# Patient Record
Sex: Female | Born: 1995 | Race: White | Hispanic: No | Marital: Married | Smoking: Never smoker
Health system: Southern US, Community
[De-identification: ages and names within clinical notes are randomized; demographics above are authoritative.]

## PROBLEM LIST (undated history)

## (undated) DIAGNOSIS — Z87898 Personal history of other specified conditions: Secondary | ICD-10-CM

## (undated) DIAGNOSIS — R569 Unspecified convulsions: Secondary | ICD-10-CM

## (undated) HISTORY — PX: WISDOM TOOTH EXTRACTION: SHX21

## (undated) HISTORY — DX: Personal history of other specified conditions: Z87.898

## (undated) HISTORY — DX: Unspecified convulsions: R56.9

---

## 2018-09-21 DIAGNOSIS — I951 Orthostatic hypotension: Secondary | ICD-10-CM

## 2018-09-21 HISTORY — DX: Orthostatic hypotension: I95.1

## 2019-09-22 NOTE — L&D Delivery Note (Addendum)
OB/GYN Faculty Practice Delivery Note  Rachel Marshall is a 24 y.o. G1P1001 s/p SVD at [redacted]w[redacted]d. She was admitted for IOL for gHTN.   ROM: 5h 105m with clear fluid GBS Status: Negative Maximum Maternal Temperature: 99.5 F  Labor Progress:  Received Cytotec and Foley bulb was placed for cervical ripening. SROM occurred. Started on pitocin. Progressed to complete without complication.  Delivery Date/Time: 03/09/2020 at 1010 Delivery: Called to room and patient was complete and pushing. Head delivered LOA. Loose nuchal cord present, which was reduced at the perineum. Shoulder and body delivered in usual fashion. Infant with spontaneous cry, placed on mother's abdomen, dried and stimulated. Cord clamped x 2 after 1-minute delay, and cut by FOB under my direct supervision. Cord blood drawn. Placenta delivered spontaneously with gentle cord traction. Fundus firm with massage and Pitocin. Labia, perineum, vagina, and cervix were inspected, found to have a left labial laceration, which was repaired with 3-0 Monocryl in the usual fashion with hemostasis.   Placenta: Intact, 3 vessel cord Complications: None Lacerations: Left labial EBL: 250 cc Analgesia: Epidural  Infant: Viable female   APGARs 8/8   per medical record   EMILY Genene Churn, MD PGY-2 Resident, Family Medicine 03/09/2020, 11:08 AM  OB FELLOW DELIVERY ATTESTATION  I was gloved and present for the delivery in its entirety, and I agree with the above resident's note.    Jerilynn Birkenhead, MD [facility] Family Medicine Fellow, [facility] for [employer], [facility]

## 2019-11-21 ENCOUNTER — Encounter: Payer: Self-pay | Admitting: Certified Nurse Midwife

## 2019-11-21 ENCOUNTER — Encounter: Payer: Self-pay | Admitting: *Deleted

## 2019-11-21 ENCOUNTER — Other Ambulatory Visit: Payer: Self-pay

## 2019-11-21 ENCOUNTER — Ambulatory Visit (INDEPENDENT_AMBULATORY_CARE_PROVIDER_SITE_OTHER): Payer: BC Managed Care – PPO | Admitting: Certified Nurse Midwife

## 2019-11-21 VITALS — BP 122/64 | HR 93 | Temp 98.6°F | Ht 69.0 in | Wt 205.0 lb

## 2019-11-21 DIAGNOSIS — Z3402 Encounter for supervision of normal first pregnancy, second trimester: Secondary | ICD-10-CM

## 2019-11-21 DIAGNOSIS — Z3490 Encounter for supervision of normal pregnancy, unspecified, unspecified trimester: Secondary | ICD-10-CM | POA: Insufficient documentation

## 2019-11-21 DIAGNOSIS — O26842 Uterine size-date discrepancy, second trimester: Secondary | ICD-10-CM

## 2019-11-21 DIAGNOSIS — Z3A23 23 weeks gestation of pregnancy: Secondary | ICD-10-CM

## 2019-11-21 DIAGNOSIS — O0932 Supervision of pregnancy with insufficient antenatal care, second trimester: Secondary | ICD-10-CM

## 2019-11-21 DIAGNOSIS — O0930 Supervision of pregnancy with insufficient antenatal care, unspecified trimester: Secondary | ICD-10-CM

## 2019-11-21 NOTE — Progress Notes (Signed)
History:   Rachel Marshall is a 24 y.o. G1P0 at [redacted]w[redacted]d by LMP being seen today for her first obstetrical visit. She is a transfer from [place]. She started prenatal care there at 17 weeks. Patient does intend to breast feed. Pregnancy history fully reviewed.  Patient reports no complaints.     HISTORY: OB History  Gravida Para Term Preterm AB Living  1 0 0 0 0 0  SAB TAB Ectopic Multiple Live Births  0 0 0 0 0    # Outcome Date GA Lbr Len/2nd Weight Sex Delivery Anes PTL Lv  1 Current             Past Medical History:  Diagnosis Date  . History of syncope    History reviewed. No pertinent surgical history. Family History  Problem Relation Age of Onset  . Breast cancer Maternal Grandmother    Social History   Tobacco Use  . Smoking status: Never Smoker  . Smokeless tobacco: Never Used  Substance Use Topics  . Alcohol use: Not Currently  . Drug use: Never   No Known Allergies Current Outpatient Medications on File Prior to Visit  Medication Sig Dispense Refill  . Prenatal Vit-Fe Fumarate-FA (PRENATAL VITAMINS PO) Take by mouth.     No current facility-administered medications on file prior to visit.    Review of Systems Pertinent items noted in HPI and remainder of comprehensive ROS otherwise negative. Physical Exam:   Vitals:   11/21/19 0957 11/21/19 0959  BP: 122/64   Pulse: 93   Temp: 98.6 F (37 C)   Weight: 205 lb (93 kg)   Height:  5\' 9"  (1.753 m)   Fetal Heart Rate (bpm): 147 Uterus:  Fundal Height: 28 cm  System: General: well-developed, well-nourished female in no acute distress   Breasts:  normal appearance, no masses or tenderness bilaterally   Skin: normal coloration and turgor, no rashes   Neurologic: oriented, normal, negative, normal mood   Extremities: normal strength, tone, and muscle mass, ROM of all joints is normal   HEENT PERRLA, extraocular movement intact and sclera clear, anicteric   Mouth/Teeth mucous membranes moist, pharynx  normal without lesions and dental hygiene good   Neck supple and no masses   Cardiovascular: regular rate and rhythm   Respiratory:  no respiratory distress, normal breath sounds   Abdomen: S>D, soft, non-tender; bowel sounds normal; no masses,  no organomegaly       Assessment:    Pregnancy: G1P0 Patient Active Problem List   Diagnosis Date Noted  . Supervision of normal pregnancy 11/21/2019  . Late prenatal care 11/21/2019     Plan:    1. Encounter for supervision of normal first pregnancy in second trimester - Welcomed to practice and introduced self to patient  - Prenatal records and anatomy 01/21/2020 reviewed - patient needs follow up US for completion based on records - Routine prenatal care - Anticipatory guidance on upcoming appointments - Reviewed safety, visitor policy, reassurance about COVID-19 for pregnancy at this time. Discussed possible changes to visits, including televisits, that may occur due to COVID-19.  The office remains open if pt needs to be seen and MAU is open 24 hours/day for OB emergencies. - Educated and discussed RLP during pregnancy and what to expect in the second trimester  - Educated and discussed GTT at next prenatal appointment, encouraged to fast after MN prior to appointment, patient verbalizes understanding.  - Babyscripts Schedule Optimization - [place] MFM OB COMP +  [place]; Future  2. Late prenatal care - Care initiated around 17 weeks in [place], had one prenatal visit (initial) prior to transfer and move to [place].   3. Uterine size-date discrepancy in second trimester - S>D, follow up US ordered to assess fetal growth - Watch FH closely    Continue prenatal vitamins. Genetic Screening discussed, AFP and NIPS: declined. Ultrasound discussed; fetal anatomic survey: ordered. Problem list reviewed and updated. The nature of [place] with multiple MDs and other Advanced Practice Providers was explained to patient;  also emphasized that residents, students are part of our team. Routine obstetric precautions reviewed. Return in about 4 weeks (around 12/19/2019) for ROB/GTT.     Lajean Manes, [place] for [employer], [place]

## 2019-11-21 NOTE — Patient Instructions (Signed)
Glucose Tolerance Test During Pregnancy Why am I having this test? The glucose tolerance test (GTT) is done to check how your body processes sugar (glucose). This is one of several tests used to diagnose diabetes that develops during pregnancy (gestational diabetes mellitus). Gestational diabetes is a temporary form of diabetes that some women develop during pregnancy. It usually occurs during the second trimester of pregnancy and goes away after delivery. Testing (screening) for gestational diabetes usually occurs between 24 and 28 weeks of pregnancy. You may have the GTT test after having a 1-hour glucose screening test if the results from that test indicate that you may have gestational diabetes. You may also have this test if:  You have a history of gestational diabetes.  You have a history of giving birth to very large babies or have experienced repeated fetal loss (stillbirth).  You have signs and symptoms of diabetes, such as: ? Changes in your vision. ? Tingling or numbness in your hands or feet. ? Changes in hunger, thirst, and urination that are not otherwise explained by your pregnancy. What is being tested? This test measures the amount of glucose in your blood at different times during a period of 3 hours. This indicates how well your body is able to process glucose. What kind of sample is taken?  Blood samples are required for this test. They are usually collected by inserting a needle into a blood vessel. How do I prepare for this test?  For 3 days before your test, eat normally. Have plenty of carbohydrate-rich foods.  Follow instructions from your health care provider about: ? Eating or drinking restrictions on the day of the test. You may be asked to not eat or drink anything other than water (fast) starting 8-10 hours before the test. ? Changing or stopping your regular medicines. Some medicines may interfere with this test. Tell a health care provider about:  All  medicines you are taking, including vitamins, herbs, eye drops, creams, and over-the-counter medicines.  Any blood disorders you have.  Any surgeries you have had.  Any medical conditions you have. What happens during the test? First, your blood glucose will be measured. This is referred to as your fasting blood glucose, since you fasted before the test. Then, you will drink a glucose solution that contains a certain amount of glucose. Your blood glucose will be measured again 1, 2, and 3 hours after drinking the solution. This test takes about 3 hours to complete. You will need to stay at the testing location during this time. During the testing period:  Do not eat or drink anything other than the glucose solution.  Do not exercise.  Do not use any products that contain nicotine or tobacco, such as cigarettes and e-cigarettes. If you need help stopping, ask your health care provider. The testing procedure may vary among health care providers and hospitals. How are the results reported? Your results will be reported as milligrams of glucose per deciliter of blood (mg/dL) or millimoles per liter (mmol/L). Your health care provider will compare your results to normal ranges that were established after testing a large group of people (reference ranges). Reference ranges may vary among labs and hospitals. For this test, common reference ranges are:  Fasting: less than 95-105 mg/dL (5.3-5.8 mmol/L).  1 hour after drinking glucose: less than 180-190 mg/dL (10.0-10.5 mmol/L).  2 hours after drinking glucose: less than 155-165 mg/dL (8.6-9.2 mmol/L).  3 hours after drinking glucose: 140-145 mg/dL (7.8-8.1 mmol/L). What do the   results mean? Results within reference ranges are considered normal, meaning that your glucose levels are well-controlled. If two or more of your blood glucose levels are high, you may be diagnosed with gestational diabetes. If only one level is high, your health care  provider may suggest repeat testing or other tests to confirm a diagnosis. Talk with your health care provider about what your results mean. Questions to ask your health care provider Ask your health care provider, or the department that is doing the test:  When will my results be ready?  How will I get my results?  What are my treatment options?  What other tests do I need?  What are my next steps? Summary  The glucose tolerance test (GTT) is one of several tests used to diagnose diabetes that develops during pregnancy (gestational diabetes mellitus). Gestational diabetes is a temporary form of diabetes that some women develop during pregnancy.  You may have the GTT test after having a 1-hour glucose screening test if the results from that test indicate that you may have gestational diabetes. You may also have this test if you have any symptoms or risk factors for gestational diabetes.  Talk with your health care provider about what your results mean. This information is not intended to replace advice given to you by your health care provider. Make sure you discuss any questions you have with your health care provider. Document Revised: 12/29/2018 Document Reviewed: 04/19/2017 Elsevier Patient Education  2020 [employer]  

## 2019-12-05 ENCOUNTER — Encounter: Payer: Self-pay | Admitting: Certified Nurse Midwife

## 2019-12-07 ENCOUNTER — Other Ambulatory Visit (HOSPITAL_COMMUNITY): Payer: Self-pay | Admitting: *Deleted

## 2019-12-07 ENCOUNTER — Ambulatory Visit (HOSPITAL_COMMUNITY)
Admission: RE | Admit: 2019-12-07 | Discharge: 2019-12-07 | Disposition: A | Discharge status: Home / Self Care | Payer: BC Managed Care – PPO | Source: Ambulatory Visit | Attending: Obstetrics and Gynecology | Admitting: Obstetrics and Gynecology

## 2019-12-07 ENCOUNTER — Other Ambulatory Visit: Payer: Self-pay

## 2019-12-07 DIAGNOSIS — Z363 Encounter for antenatal screening for malformations: Secondary | ICD-10-CM

## 2019-12-07 DIAGNOSIS — O26842 Uterine size-date discrepancy, second trimester: Secondary | ICD-10-CM

## 2019-12-07 DIAGNOSIS — Z3A24 24 weeks gestation of pregnancy: Secondary | ICD-10-CM

## 2019-12-07 DIAGNOSIS — Z3402 Encounter for supervision of normal first pregnancy, second trimester: Secondary | ICD-10-CM

## 2019-12-07 DIAGNOSIS — O321XX0 Maternal care for breech presentation, not applicable or unspecified: Secondary | ICD-10-CM | POA: Diagnosis not present

## 2019-12-07 DIAGNOSIS — Z362 Encounter for other antenatal screening follow-up: Secondary | ICD-10-CM

## 2019-12-15 ENCOUNTER — Ambulatory Visit (INDEPENDENT_AMBULATORY_CARE_PROVIDER_SITE_OTHER): Payer: BC Managed Care – PPO | Admitting: Certified Nurse Midwife

## 2019-12-15 ENCOUNTER — Other Ambulatory Visit: Payer: Self-pay

## 2019-12-15 VITALS — BP 123/66 | HR 83 | Wt 213.0 lb

## 2019-12-15 DIAGNOSIS — Z3480 Encounter for supervision of other normal pregnancy, unspecified trimester: Secondary | ICD-10-CM

## 2019-12-15 NOTE — Patient Instructions (Signed)
Pediatrician List [place]  [facility] @ [place] [address] [place], [place] [phone]  [facility] @ [facility] [address] [place], [place] [phone]  [facility] @ [place] [address] [place], [place] [phone]  [facility] [address] [place], [place] [phone]  [facility] @ [facility] [address] [place], [place] [phone]  [facility] @ [place] [address] [place], [place] [phone]  [facility] (Pediatrics) [address] [place], [place] [phone]  [facility] @ [address] [phone]  [facility] [address] [place], [place] [phone]  [facility] [address] [place], [place] [phone]  [facility] [address] [phone]  [address] [phone]   AREA PEDIATRIC/FAMILY PRACTICE PHYSICIANS  [facility] [place] [address] [place], [place] [address] Phone - [phone]   Fax - [phone]  JACK AMOS [address] [place], [place]  [address] Phone - [phone]   Fax - [phone]  [facility] [address] [place], [place]  [address] Phone - [phone]   Fax - [phone]  [facility] [address] [place], [place]  [address] Phone - [phone]   Fax - [phone]  [facility] [address] [place], [place]  [address] Phone - [phone]   Fax - [phone]  [facility] [address] [place], [place]  [address] Phone - [phone]   Fax - [phone]  [facility] [place] [address] [place], [place]  [address] Phone -  [phone]   Fax - [phone]  [facility] AT [facility] [address] [place], [address] [place], [place]  [address] Phone - [phone]   Fax - [phone]  [facility] AT [facility] [address] [place], [place]  [address] Phone - [phone]   Fax - [phone] [facility] AT [place] [address] [place], [place]  [address] Phone - [phone]   Fax - [phone]  [facility] [address] [place], [place]  [address] Phone - [phone]   Fax - [phone]  [facility] [address] [place], [place]  [address] Phone - [phone]   Fax - [phone]  [facility] [place] [address] [place], [place]  [address] Phone - [phone]   Fax - [phone]  [facility] [address] [place], [place]  [address] Phone - [phone]  [facility] [address] [place], [place]  [address] Phone - [phone]   Fax - [phone]  [facility] [address] [place], [place]  [address] Phone - [phone]   Fax - [phone]  [facility] [address] [place], [place]  [address] Phone - [phone]   Fax - [phone]  [facility] [address] [place], [place]  [address] Phone - [phone]   Fax - [phone]   [facility] [address] [place] [address] [place], [place]  [address] Phone - [phone]   Fax - [phone]  [facility] [address] [place], [place]  [address] Phone - [phone]   Fax - [phone]  DAVID RUBIN [address] [place], [place]  [address] Phone - [phone]   Fax - [phone]  [facility] [address] [place], [place]  [address] Phone - [phone]   Fax - [phone]  [facility] [place] [address]  [place], [place]  [address] Phone - [phone]   Fax - [phone] Gerarda Fraction [address] [place], [place]  [address] Phone - [phone]   Fax - [phone]  [facility] [address] [place], [place]  [address] Phone - [phone]   Fax - [phone]  [facility] - [place] [address] [place], [place]  [address] Phone - [phone]   Fax - [phone]    Third Trimester of Pregnancy The third trimester is from week 28 through week 40 (months 7 through 9). The third trimester is a time when the unborn baby (fetus) is growing rapidly. At the end of the ninth month, the fetus is about 20 inches in length and weighs 6-10 pounds. Body changes during your third trimester Your body will continue to go through many changes during pregnancy. The changes vary from woman to woman. During the third trimester:  Your weight will continue to increase. You can expect to gain 25-35 pounds (11-16 kg) by the end of the pregnancy.  You may begin to get stretch marks on your hips, abdomen, and breasts.  You may urinate more often because the fetus is moving lower into your pelvis and pressing on your bladder.  You may develop or continue to have heartburn. This is caused by increased hormones that slow down muscles in the digestive tract.  You may develop or continue to have constipation because increased hormones slow digestion and cause the muscles that push waste through your intestines to relax.  You may develop hemorrhoids. These are swollen veins (varicose veins) in the rectum that can itch or be painful.  You may develop swollen, bulging veins (varicose veins) in your legs.  You may have increased body aches in the pelvis, back, or thighs. This is due to weight gain and increased hormones that are relaxing your joints.  You may have changes in your hair. These can include thickening of your hair, rapid growth, and  changes in texture. Some women also have hair loss during or after pregnancy, or hair that feels dry or thin. Your hair will most likely return to normal after your baby is born.  Your breasts will continue to grow and they will continue to become tender. A yellow fluid (colostrum) may leak from your breasts. This is the first milk you are producing for your baby.  Your belly button may stick out.  You may notice more swelling in your hands, face, or ankles.  You may have increased tingling or numbness in your hands, arms, and legs. The skin on your belly may also feel numb.  You may feel short of breath because of your expanding uterus.  You may have more problems sleeping. This can be caused by the size of your belly, increased need to urinate, and an increase in your body's metabolism.  You may notice the fetus "dropping," or moving lower in your abdomen (lightening).  You may have increased vaginal discharge.  You may notice your joints feel loose and you may have pain around your pelvic bone. What to expect at prenatal visits You will have prenatal exams every 2 weeks until week 36. Then you will have weekly prenatal exams. During a routine prenatal visit:  You will be weighed to make sure you and the baby are growing normally.  Your  blood pressure will be taken.  Your abdomen will be measured to track your baby's growth.  The fetal heartbeat will be listened to.  Any test results from the previous visit will be discussed.  You may have a cervical check near your due date to see if your cervix has softened or thinned (effaced).  You will be tested for Group B streptococcus. This happens between 35 and 37 weeks. Your health care provider may ask you:  What your birth plan is.  How you are feeling.  If you are feeling the baby move.  If you have had any abnormal symptoms, such as leaking fluid, bleeding, severe headaches, or abdominal cramping.  If you are using any  tobacco products, including cigarettes, chewing tobacco, and electronic cigarettes.  If you have any questions. Other tests or screenings that may be performed during your third trimester include:  Blood tests that check for low iron levels (anemia).  Fetal testing to check the health, activity level, and growth of the fetus. Testing is done if you have certain medical conditions or if there are problems during the pregnancy.  Nonstress test (NST). This test checks the health of your baby to make sure there are no signs of problems, such as the baby not getting enough oxygen. During this test, a belt is placed around your belly. The baby is made to move, and its heart rate is monitored during movement. What is false labor? False labor is a condition in which you feel small, irregular tightenings of the muscles in the womb (contractions) that usually go away with rest, changing position, or drinking water. These are called Braxton Hicks contractions. Contractions may last for hours, days, or even weeks before true labor sets in. If contractions come at regular intervals, become more frequent, increase in intensity, or become painful, you should see your health care provider. What are the signs of labor?  Abdominal cramps.  Regular contractions that start at 10 minutes apart and become stronger and more frequent with time.  Contractions that start on the top of the uterus and spread down to the lower abdomen and back.  Increased pelvic pressure and dull back pain.  A watery or bloody mucus discharge that comes from the vagina.  Leaking of amniotic fluid. This is also known as your "water breaking." It could be a slow trickle or a gush. Let your health care provider know if it has a color or strange odor. If you have any of these signs, call your health care provider right away, even if it is before your due date. Follow these instructions at home: Medicines  Follow your health care  provider's instructions regarding medicine use. Specific medicines may be either safe or unsafe to take during pregnancy.  Take a prenatal vitamin that contains at least 600 micrograms (mcg) of folic acid.  If you develop constipation, try taking a stool softener if your health care provider approves. Eating and drinking   Eat a balanced diet that includes fresh fruits and vegetables, whole grains, good sources of protein such as meat, eggs, or tofu, and low-fat dairy. Your health care provider will help you determine the amount of weight gain that is right for you.  Avoid raw meat and uncooked cheese. These carry germs that can cause birth defects in the baby.  If you have low calcium intake from food, talk to your health care provider about whether you should take a daily calcium supplement.  Eat four or five small  meals rather than three large meals a day.  Limit foods that are high in fat and processed sugars, such as fried and sweet foods.  To prevent constipation: ? Drink enough fluid to keep your urine clear or pale yellow. ? Eat foods that are high in fiber, such as fresh fruits and vegetables, whole grains, and beans. Activity  Exercise only as directed by your health care provider. Most women can continue their usual exercise routine during pregnancy. Try to exercise for 30 minutes at least 5 days a week. Stop exercising if you experience uterine contractions.  Avoid heavy lifting.  Do not exercise in extreme heat or humidity, or at high altitudes.  Wear low-heel, comfortable shoes.  Practice good posture.  You may continue to have sex unless your health care provider tells you otherwise. Relieving pain and discomfort  Take frequent breaks and rest with your legs elevated if you have leg cramps or low back pain.  Take warm sitz baths to soothe any pain or discomfort caused by hemorrhoids. Use hemorrhoid cream if your health care provider approves.  Wear a good  support bra to prevent discomfort from breast tenderness.  If you develop varicose veins: ? Wear support pantyhose or compression stockings as told by your healthcare provider. ? Elevate your feet for 15 minutes, 3-4 times a day. Prenatal care  Write down your questions. Take them to your prenatal visits.  Keep all your prenatal visits as told by your health care provider. This is important. Safety  Wear your seat belt at all times when driving.  Make a list of emergency phone numbers, including numbers for family, friends, the hospital, and police and fire departments. General instructions  Avoid cat litter boxes and soil used by cats. These carry germs that can cause birth defects in the baby. If you have a cat, ask someone to clean the litter box for you.  Do not travel far distances unless it is absolutely necessary and only with the approval of your health care provider.  Do not use hot tubs, steam rooms, or saunas.  Do not drink alcohol.  Do not use any products that contain nicotine or tobacco, such as cigarettes and e-cigarettes. If you need help quitting, ask your health care provider.  Do not use any medicinal herbs or unprescribed drugs. These chemicals affect the formation and growth of the baby.  Do not douche or use tampons or scented sanitary pads.  Do not cross your legs for long periods of time.  To prepare for the arrival of your baby: ? Take prenatal classes to understand, practice, and ask questions about labor and delivery. ? Make a trial run to the hospital. ? Visit the hospital and tour the maternity area. ? Arrange for maternity or paternity leave through employers. ? Arrange for family and friends to take care of pets while you are in the hospital. ? Purchase a rear-facing car seat and make sure you know how to install it in your car. ? Pack your hospital bag. ? Prepare the baby's nursery. Make sure to remove all pillows and stuffed animals from the  baby's crib to prevent suffocation.  Visit your dentist if you have not gone during your pregnancy. Use a soft toothbrush to brush your teeth and be gentle when you floss. Contact a health care provider if:  You are unsure if you are in labor or if your water has broken.  You become dizzy.  You have mild pelvic cramps, pelvic pressure,  or nagging pain in your abdominal area.  You have lower back pain.  You have persistent nausea, vomiting, or diarrhea.  You have an unusual or bad smelling vaginal discharge.  You have pain when you urinate. Get help right away if:  Your water breaks before 37 weeks.  You have regular contractions less than 5 minutes apart before 37 weeks.  You have a fever.  You are leaking fluid from your vagina.  You have spotting or bleeding from your vagina.  You have severe abdominal pain or cramping.  You have rapid weight loss or weight gain.  You have shortness of breath with chest pain.  You notice sudden or extreme swelling of your face, hands, ankles, feet, or legs.  Your baby makes fewer than 10 movements in 2 hours.  You have severe headaches that do not go away when you take medicine.  You have vision changes. Summary  The third trimester is from week 28 through week 40, months 7 through 9. The third trimester is a time when the unborn baby (fetus) is growing rapidly.  During the third trimester, your discomfort may increase as you and your baby continue to gain weight. You may have abdominal, leg, and back pain, sleeping problems, and an increased need to urinate.  During the third trimester your breasts will keep growing and they will continue to become tender. A yellow fluid (colostrum) may leak from your breasts. This is the first milk you are producing for your baby.  False labor is a condition in which you feel small, irregular tightenings of the muscles in the womb (contractions) that eventually go away. These are called Braxton  Hicks contractions. Contractions may last for hours, days, or even weeks before true labor sets in.  Signs of labor can include: abdominal cramps; regular contractions that start at 10 minutes apart and become stronger and more frequent with time; watery or bloody mucus discharge that comes from the vagina; increased pelvic pressure and dull back pain; and leaking of amniotic fluid. This information is not intended to replace advice given to you by your health care provider. Make sure you discuss any questions you have with your health care provider. Document Revised: 12/29/2018 Document Reviewed: 10/13/2016 Elsevier Patient Education  [place].

## 2019-12-15 NOTE — Progress Notes (Signed)
Subjective:  Rachel Marshall is a 24 y.o. G1P0 at [redacted]w[redacted]d being seen today for ongoing prenatal care.  She is currently monitored for the following issues for this low-risk pregnancy and has Supervision of normal pregnancy and Late prenatal care on their problem list.  Patient reports no complaints.  Contractions: Not present. Vag. Bleeding: None.  Movement: Present. Denies leaking of fluid.   The following portions of the patient's history were reviewed and updated as appropriate: allergies, current medications, past family history, past medical history, past social history, past surgical history and problem list. Problem list updated.  Objective:   Vitals:   12/15/19 0846  BP: 123/66  Pulse: 83  Weight: 213 lb (96.6 kg)    Fetal Status: Fetal Heart Rate (bpm): 147   Movement: Present     General:  Alert, oriented and cooperative. Patient is in no acute distress.  Skin: Skin is warm and dry. No rash noted.   Cardiovascular: Normal heart rate noted  Respiratory: Normal respiratory effort, no problems with respiration noted  Abdomen: Soft, gravid, appropriate for gestational age. Pain/Pressure: Absent     Pelvic: Vag. Bleeding: None Vag D/C Character: Thin   Cervical exam deferred        Extremities: Normal range of motion.  Edema: None  Mental Status: Normal mood and affect. Normal behavior. Normal judgment and thought content.   Urinalysis:      Assessment and Plan:  Pregnancy: G1P0 at [redacted]w[redacted]d  1. Supervision of other normal pregnancy, antepartum - 2Hr GTT w/ 1 Hr Carpenter 75 g - HIV antibody (with reflex) - CBC - RPR  Preterm labor symptoms and general obstetric precautions including but not limited to vaginal bleeding, contractions, leaking of fluid and fetal movement were reviewed in detail with the patient. Please refer to After Visit Summary for other counseling recommendations.  Return in about 3 weeks (around 01/05/2020).- virtual   Donette Larry, CNM

## 2019-12-18 LAB — HIV ANTIBODY (ROUTINE TESTING W REFLEX): HIV 1&2 Ab, 4th Generation: NONREACTIVE

## 2019-12-18 LAB — CBC
HCT: 35 % (ref 35.0–45.0)
Hemoglobin: 11.5 g/dL — ABNORMAL LOW (ref 11.7–15.5)
MCH: 30.2 pg (ref 27.0–33.0)
MCHC: 32.9 g/dL (ref 32.0–36.0)
MCV: 91.9 fL (ref 80.0–100.0)
MPV: 11.2 fL (ref 7.5–12.5)
Platelets: 228 10*3/uL (ref 140–400)
RBC: 3.81 10*6/uL (ref 3.80–5.10)
RDW: 13 % (ref 11.0–15.0)
WBC: 10 10*3/uL (ref 3.8–10.8)

## 2019-12-18 LAB — 2HR GTT W 1 HR, CARPENTER, 75 G
Glucose, 1 Hr, Gest: 124 mg/dL (ref 65–179)
Glucose, 2 Hr, Gest: 100 mg/dL (ref 65–152)
Glucose, Fasting, Gest: 77 mg/dL (ref 65–91)

## 2019-12-18 LAB — RPR: RPR Ser Ql: NONREACTIVE

## 2019-12-29 ENCOUNTER — Ambulatory Visit (HOSPITAL_COMMUNITY)
Admission: RE | Admit: 2019-12-29 | Discharge: 2019-12-29 | Disposition: A | Discharge status: Home / Self Care | Payer: BC Managed Care – PPO | Source: Ambulatory Visit | Attending: Obstetrics and Gynecology | Admitting: Obstetrics and Gynecology

## 2019-12-29 ENCOUNTER — Encounter (HOSPITAL_COMMUNITY): Payer: Self-pay

## 2019-12-29 ENCOUNTER — Ambulatory Visit (HOSPITAL_COMMUNITY): Payer: BC Managed Care – PPO | Admitting: *Deleted

## 2019-12-29 ENCOUNTER — Other Ambulatory Visit: Payer: Self-pay

## 2019-12-29 ENCOUNTER — Other Ambulatory Visit (HOSPITAL_COMMUNITY): Payer: Self-pay | Admitting: *Deleted

## 2019-12-29 VITALS — BP 131/73 | HR 80 | Temp 97.6°F

## 2019-12-29 DIAGNOSIS — Z362 Encounter for other antenatal screening follow-up: Secondary | ICD-10-CM | POA: Diagnosis not present

## 2019-12-29 DIAGNOSIS — Z3A28 28 weeks gestation of pregnancy: Secondary | ICD-10-CM | POA: Diagnosis not present

## 2019-12-29 DIAGNOSIS — O0932 Supervision of pregnancy with insufficient antenatal care, second trimester: Secondary | ICD-10-CM | POA: Diagnosis not present

## 2019-12-29 DIAGNOSIS — O0990 Supervision of high risk pregnancy, unspecified, unspecified trimester: Secondary | ICD-10-CM | POA: Diagnosis present

## 2019-12-29 DIAGNOSIS — Z363 Encounter for antenatal screening for malformations: Secondary | ICD-10-CM

## 2020-01-05 ENCOUNTER — Encounter: Payer: Self-pay | Admitting: Certified Nurse Midwife

## 2020-01-05 ENCOUNTER — Telehealth: Payer: Self-pay | Admitting: *Deleted

## 2020-01-05 ENCOUNTER — Telehealth (INDEPENDENT_AMBULATORY_CARE_PROVIDER_SITE_OTHER): Payer: BC Managed Care – PPO | Admitting: Certified Nurse Midwife

## 2020-01-05 DIAGNOSIS — Z0374 Encounter for suspected problem with fetal growth ruled out: Secondary | ICD-10-CM

## 2020-01-05 DIAGNOSIS — Z3490 Encounter for supervision of normal pregnancy, unspecified, unspecified trimester: Secondary | ICD-10-CM

## 2020-01-05 NOTE — Telephone Encounter (Signed)
Left patient a message that next appointment is scheduled for Friday, January 19, 2020 at 10:30 AM, virtual. Patient can call or [facility] message if she needs to change appointment.

## 2020-01-05 NOTE — Progress Notes (Signed)
OBSTETRICS PRENATAL VIRTUAL VISIT ENCOUNTER NOTE  Provider location: [facility] [place]   I connected with Rachel Marshall on 01/05/20 at  8:30 AM EDT by MyChart Video Encounter at home and verified that I am speaking with the correct person using two identifiers.   I discussed the limitations, risks, security and privacy concerns of performing an evaluation and management service virtually and the availability of in person appointments. I also discussed with the patient that there may be a patient responsible charge related to this service. The patient expressed understanding and agreed to proceed. Subjective:  Rachel DikeChelsea Marshall is a 24 y.o. G1P0 at 6384w6d being seen today for ongoing prenatal care.  She is currently monitored for the following issues for this low-risk pregnancy and has Supervision of normal pregnancy and Late prenatal care on their problem list.  Patient reports mucous vaginal discharge. No watery fluid, spotting, or VB.No itching or malodor   .  .   . Denies any leaking of fluid.   The following portions of the patient's history were reviewed and updated as appropriate: allergies, current medications, past family history, past medical history, past social history, past surgical history and problem list.   Objective:  BP 123/66 Weight 211 lbs  Fetal Status:           General:  Alert, oriented and cooperative. Patient is in no acute distress.  Respiratory: Normal respiratory effort, no problems with respiration noted  Mental Status: Normal mood and affect. Normal behavior. Normal judgment and thought content.  Rest of physical exam deferred due to type of encounter  Imaging: US MFM OB COMP + 14 WK  Result Date: 12/07/2019 ----------------------------------------------------------------------  OBSTETRICS REPORT                       (Signed Final 12/07/2019 10:40 am) ---------------------------------------------------------------------- Patient  Info  ID #:       [id number]                          D.O.B.:  [birth year] (23 yrs)  Name:       Rachel Marshall                 Visit Date: 12/07/2019 09:39 am ---------------------------------------------------------------------- Performed By  Performed By:     Percell BostonHeather Waken          Ref. Address:     [address]                    [place]                                                             [place], [place]  Attending:        Noralee Spaceavi Shankar MD        [facility]  Referred By:      Everardo All ---------------------------------------------------------------------- Orders   #  Description                          Code         Ordered By   1  [place] MFM OB COMP + 14 WK               30865.78     Steward Drone  ----------------------------------------------------------------------   #  Order #                    Accession #                 Episode #   1  [id number]                  [id number]                  [id number]  ---------------------------------------------------------------------- Indications   [redacted] weeks gestation of pregnancy                Z3A.25   Size-Date Discrepancy                          O26.849   Encounter for antenatal screening for          Z36.3   malformations   No genetic testing in chart  ---------------------------------------------------------------------- Fetal Evaluation  Num Of Fetuses:         1  Fetal Heart Rate(bpm):  138  Cardiac Activity:       Observed  Presentation:           Breech  Placenta:               Posterior  P. Cord Insertion:      Visualized, central  Amniotic Fluid  AFI FV:      Within normal limits                              Largest Pocket(cm)                              4.43 ---------------------------------------------------------------------- Biometry  BPD:      59.4  mm     G. Age:  24w 2d          5  %    CI:        69.52   %    70 - 86                                                           FL/HC:      18.3   %    18.6 - 20.4  HC:      227.4  mm     G. Age:  24w 5d          6  %    HC/AC:      1.07        1.04 - 1.22  AC:       213   mm  G. Age:  25w 6d         44  %    FL/BPD:     70.2   %    71 - 87  FL:       41.7  mm     G. Age:  23w 4d          1  %    FL/AC:      19.6   %    20 - 24  HUM:      41.2  mm     G. Age:  25w 0d         25  %  CER:      28.8  mm     G. Age:  25w 6d         51  %  CM:        4.8  mm  Est. FW:     738  gm    1 lb 10 oz      11  % ---------------------------------------------------------------------- OB History  Gravidity:    1         Term:   0        Prem:   0        SAB:   0  TOP:          0       Ectopic:  0        Living: 0 ---------------------------------------------------------------------- Gestational Age  LMP:           25w 5d        Date:  06/10/19                 EDD:   03/16/20  U/S Today:     24w 4d                                        EDD:   03/24/20  Best:          25w 5d     Det. By:  LMP  (06/10/19)          EDD:   03/16/20 ---------------------------------------------------------------------- Anatomy  Cranium:               Appears normal         Aortic Arch:            Not well visualized  Cavum:                 Appears normal         Ductal Arch:            Not well visualized  Ventricles:            Appears normal         Diaphragm:              Appears normal  Choroid Plexus:        Appears normal         Stomach:                Appears normal, left  sided  Cerebellum:            Appears normal         Abdomen:                Appears normal  Posterior Fossa:       Appears normal         Abdominal Wall:         Appears nml (cord                                                                        insert, abd wall)  Nuchal Fold:           Not applicable (>69    Cord Vessels:           Appears normal ([redacted]                         wks GA)                                         vessel cord)  Face:                  Appears normal         Kidneys:                Appear normal                         (orbits and profile)  Lips:                  Appears normal         Bladder:                Appears normal  Thoracic:              Appears normal         Spine:                  Not well visualized  Heart:                 Not well visualized    Upper Extremities:      Appears normal  RVOT:                  Appears normal         Lower Extremities:      Appears normal  LVOT:                  Appears normal  Other:  Heels and 5th digit visualized. Nasal bone visualized. Technically          difficult due to fetal position. Patient had a complete anatomical          survey at another facility previously. ---------------------------------------------------------------------- Cervix Uterus Adnexa  Cervix  Not visualized (advanced GA >24wks)  Uterus  No abnormality visualized.  Left Ovary  No adnexal mass visualized.  Right Ovary  No adnexal mass visualized.  Cul De Sac  No free fluid seen.  Adnexa  No abnormality visualized. ---------------------------------------------------------------------- Impression  Ms. Jann, G1 P0, is here for fetal anatomy scan. On your  office clinical examination, uterine-size date discrepancy was  seen. Patient relocated from [facility] to [place]. She had fetal  anatomy scan in [place]. She had opted not to screen for  fetal aneuploidies.  In 2012, she had one episode of seizures following a  vasovagal episode.  She did not have recurrence of seizures.  Patient reports no chronic medical conditions.  We performed fetal anatomy scan. No makers of  aneuploidies or fetal structural defects are seen. Fetal  biometry is consistent with her previously-established dates.  The estimated fetal weight is at the 11th percentile.  Amniotic  fluid is normal and good fetal activity is seen. Patient  understands the limitations of ultrasound in detecting fetal   anomalies. ---------------------------------------------------------------------- Recommendations  -An appointment was made for her to return in 3 weeks for  fetal growth assessment and completion of fetal anatomy if  feasible. ----------------------------------------------------------------------                  Noralee Space, MD Electronically Signed Final Report   12/07/2019 10:40 am ----------------------------------------------------------------------  [place] MFM OB FOLLOW UP  Result Date: 12/29/2019 ----------------------------------------------------------------------  OBSTETRICS REPORT                       (Signed Final 12/29/2019 10:33 am) ---------------------------------------------------------------------- Patient Info  ID #:       [id number]                          D.O.B.:  [birth year] (23 yrs)  Name:       Rachel Marshall                 Visit Date: 12/29/2019 08:43 am ---------------------------------------------------------------------- Performed By  Performed By:     Tomma Lightning             Ref. Address:     [address]                    [place]                                                             [place], [place]  Attending:        Ma Rings MD         [facility]  Referred By:      Everardo All ---------------------------------------------------------------------- Orders   #  Description                          Code         Ordered By   1  [place] MFM OB FOLLOW UP  56314.97     Noralee Space  ----------------------------------------------------------------------   #  Order #                    Accession #                 Episode #   1  [id number]                  [id number]                  [id number]  ---------------------------------------------------------------------- Indications   Size-Date Discrepancy                          O26.849   Antenatal follow-up for  nonvisualized fetal    Z36.2   anatomy   [redacted] weeks gestation of pregnancy                Z3A.28   No genetic testing in chart   Late prenatal care, second trimester           O09.32  ---------------------------------------------------------------------- Fetal Evaluation  Num Of Fetuses:         1  Fetal Heart Rate(bpm):  137  Cardiac Activity:       Observed  Presentation:           Cephalic  Placenta:               Posterior  P. Cord Insertion:      Previously Visualized  Amniotic Fluid  AFI FV:      Within normal limits  AFI Sum(cm)     %Tile       Largest Pocket(cm)  14.49           50          4.28  RUQ(cm)       RLQ(cm)       LUQ(cm)        LLQ(cm)  3.16          3.62          3.43           4.28 ---------------------------------------------------------------------- Biometry  BPD:        72  mm     G. Age:  28w 6d         39  %    CI:        74.88   %    70 - 86                                                          FL/HC:      19.2   %    19.6 - 20.8  HC:       264   mm     G. Age:  28w 5d         16  %    HC/AC:      1.09        0.99 - 1.21  AC:      241.5  mm     G. Age:  28w 3d         30  %    FL/BPD:     70.6   %  71 - 87  FL:       50.8  mm     G. Age:  27w 2d          5  %    FL/AC:      21.0   %    20 - 24  HUM:      45.9  mm     G. Age:  27w 0d          7  %  Est. FW:    1172  gm      2 lb 9 oz     15  % ---------------------------------------------------------------------- OB History  Gravidity:    1         Term:   0        Prem:   0        SAB:   0  TOP:          0       Ectopic:  0        Living: 0 ---------------------------------------------------------------------- Gestational Age  LMP:           28w 6d        Date:  06/10/19                 EDD:   03/16/20  U/S Today:     28w 2d                                        EDD:   03/20/20  Best:          28w 6d     Det. By:  LMP  (06/10/19)          EDD:   03/16/20 ---------------------------------------------------------------------- Anatomy   Cranium:               Appears normal         Aortic Arch:            Appears normal  Cavum:                 Previously seen        Ductal Arch:            Appears normal  Ventricles:            Previously seen        Diaphragm:              Appears normal  Choroid Plexus:        Previously seen        Stomach:                Appears normal, left                                                                        sided  Cerebellum:            Previously seen        Abdomen:                Appears normal  Posterior Fossa:  Previously seen        Abdominal Wall:         Previously seen  Nuchal Fold:           Not applicable (>20    Cord Vessels:           Previously seen                         wks GA)  Face:                  Orbits and profile     Kidneys:                Appear normal                         previously seen  Lips:                  Previously seen        Bladder:                Appears normal  Thoracic:              Appears normal         Spine:                  Not well visualized  Heart:                 Appears normal         Upper Extremities:      Previously seen                         (4CH, axis, and                         situs)  RVOT:                  Appears normal         Lower Extremities:      Previously seen  LVOT:                  Appears normal  Other:  Heels and 5th digit prev visualized. Nasal bone prev visualized.          Technically difficult due to fetal position. ---------------------------------------------------------------------- Cervix Uterus Adnexa  Cervix  Not visualized (advanced GA >24wks) ---------------------------------------------------------------------- Comments  This patient was seen for a follow up exam as the fetal  cardiac views were unable to be fully visualized during her  prior exam.  She denies any problems since her last exam.  She was informed that the fetal growth measured today is in  the lower normal range.  There was normal amniotic fluid   noted today.  The views of the fetal heart were visualized today.  There  were no obvious anomalies suspected.  The limitations of  ultrasound in the detection of all anomalies was discussed.  As the overall fetal growth is in the lower normal limits, a  follow-up growth scan was scheduled in 4 weeks. ----------------------------------------------------------------------                   Ma Rings, MD Electronically Signed Final Report   12/29/2019 10:33 am ----------------------------------------------------------------------   Assessment and Plan:  Pregnancy: G1P0 at [redacted]w[redacted]d 1. Encounter for supervision of normal pregnancy, antepartum, unspecified  gravidity - anticipatory guidence  2. Borderline FGR - 15 %ile at 28 wks - f/u US in 4 weeks scheduled  Preterm labor symptoms and general obstetric precautions including but not limited to vaginal bleeding, contractions, leaking of fluid and fetal movement were reviewed in detail with the patient. I discussed the assessment and treatment plan with the patient. The patient was provided an opportunity to ask questions and all were answered. The patient agreed with the plan and demonstrated an understanding of the instructions. The patient was advised to call back or seek an in-person office evaluation/go to [facility] at [facility] for any urgent or concerning symptoms. Please refer to After Visit Summary for other counseling recommendations.   I provided 7 minutes of face-to-face time during this encounter.  Return in about 2 weeks (around 01/19/2020).  Future Appointments  Date Time Provider Department Center  01/26/2020 11:30 AM [facility] NURSE [facility] M[facility]-US  01/26/2020 11:30 AM WH-M[facility] [place] 5 WH-MFCUS MFC-US    Donette Larry, CNM [facility] [employer]

## 2020-01-19 ENCOUNTER — Encounter: Payer: BC Managed Care – PPO | Admitting: Certified Nurse Midwife

## 2020-01-19 ENCOUNTER — Telehealth: Payer: Self-pay | Admitting: *Deleted

## 2020-01-19 NOTE — Telephone Encounter (Signed)
Left patient an urgent message to call the office or get on [facility] visit by 10:45 AM or she will need to reschedule.

## 2020-01-22 NOTE — Progress Notes (Signed)
Unable to reach pt for video visit This encounter was created in error - please disregard.

## 2020-01-26 ENCOUNTER — Ambulatory Visit (HOSPITAL_COMMUNITY): Payer: 59 | Attending: Obstetrics and Gynecology

## 2020-01-26 ENCOUNTER — Ambulatory Visit: Payer: 59 | Admitting: *Deleted

## 2020-01-26 ENCOUNTER — Other Ambulatory Visit: Payer: Self-pay

## 2020-01-26 DIAGNOSIS — Z362 Encounter for other antenatal screening follow-up: Secondary | ICD-10-CM

## 2020-01-26 DIAGNOSIS — O0933 Supervision of pregnancy with insufficient antenatal care, third trimester: Secondary | ICD-10-CM

## 2020-01-26 DIAGNOSIS — Z0374 Encounter for suspected problem with fetal growth ruled out: Secondary | ICD-10-CM

## 2020-01-26 DIAGNOSIS — Z363 Encounter for antenatal screening for malformations: Secondary | ICD-10-CM | POA: Insufficient documentation

## 2020-01-26 DIAGNOSIS — Z3A32 32 weeks gestation of pregnancy: Secondary | ICD-10-CM

## 2020-01-26 DIAGNOSIS — O26843 Uterine size-date discrepancy, third trimester: Secondary | ICD-10-CM

## 2020-02-28 ENCOUNTER — Ambulatory Visit (INDEPENDENT_AMBULATORY_CARE_PROVIDER_SITE_OTHER): Payer: 59 | Admitting: Obstetrics and Gynecology

## 2020-02-28 ENCOUNTER — Other Ambulatory Visit (HOSPITAL_COMMUNITY)
Admission: RE | Admit: 2020-02-28 | Discharge: 2020-02-28 | Disposition: A | Discharge status: Home / Self Care | Payer: 59 | Source: Ambulatory Visit | Attending: Obstetrics and Gynecology | Admitting: Obstetrics and Gynecology

## 2020-02-28 ENCOUNTER — Other Ambulatory Visit: Payer: Self-pay

## 2020-02-28 VITALS — BP 122/71 | HR 104 | Wt 221.0 lb

## 2020-02-28 DIAGNOSIS — Z23 Encounter for immunization: Secondary | ICD-10-CM | POA: Diagnosis not present

## 2020-02-28 DIAGNOSIS — Z3A37 37 weeks gestation of pregnancy: Secondary | ICD-10-CM

## 2020-02-28 DIAGNOSIS — Z3403 Encounter for supervision of normal first pregnancy, third trimester: Secondary | ICD-10-CM | POA: Insufficient documentation

## 2020-02-28 MED ORDER — TERCONAZOLE 0.4 % VA CREA: 1.0000 | TOPICAL_CREAM | Freq: Every day | VAGINAL | 0 refills | Status: DC

## 2020-02-28 NOTE — Progress Notes (Signed)
   PRENATAL VISIT NOTE  Subjective:  Rachel Marshall is a 24 y.o. G1P0 at [redacted]w[redacted]d being seen today for ongoing prenatal care.  She is currently monitored for the following issues for this low-risk pregnancy and has Supervision of normal pregnancy; Late prenatal care; and Fetal growth problem suspected but not found on their problem list.  Patient reports no complaints.  Contractions: Not present. Vag. Bleeding: None.  Movement: Present. Denies leaking of fluid.   The following portions of the patient's history were reviewed and updated as appropriate: allergies, current medications, past family history, past medical history, past social history, past surgical history and problem list.   Objective:   Vitals:   02/28/20 0905  BP: 122/71  Pulse: (!) 104  Weight: 221 lb (100.2 kg)    Fetal Status: Fetal Heart Rate (bpm): 145 Fundal Height: 36 cm Movement: Present  Presentation: Vertex  General:  Alert, oriented and cooperative. Patient is in no acute distress.  Skin: Skin is warm and dry. No rash noted.   Cardiovascular: Normal heart rate noted  Respiratory: Normal respiratory effort, no problems with respiration noted  Abdomen: Soft, gravid, appropriate for gestational age.  Pain/Pressure: Present     Pelvic: Cervical exam performed in the presence of a chaperone Dilation: 1.5 Effacement (%): Thick, 50 Station: Ballotable  Extremities: Normal range of motion.  Edema: Mild pitting, slight indentation  Mental Status: Normal mood and affect. Normal behavior. Normal judgment and thought content.   Assessment and Plan:  Pregnancy: G1P0 at [redacted]w[redacted]d 1. Encounter for supervision of normal first pregnancy in third trimester  - Culture, beta strep (group b only) - Cervicovaginal ancillary only( [place]) - TDAP given today.   Term labor symptoms and general obstetric precautions including but not limited to vaginal bleeding, contractions, leaking of fluid and fetal movement were reviewed in  detail with the patient. Please refer to After Visit Summary for other counseling recommendations.   Return in about 1 week (around 03/06/2020) for In person visit. Marland Kitchen  No future appointments.  Venia Carbon, NP

## 2020-02-28 NOTE — Patient Instructions (Signed)

## 2020-02-29 LAB — CERVICOVAGINAL ANCILLARY ONLY
Chlamydia: NEGATIVE
Comment: NEGATIVE
Comment: NORMAL
Neisseria Gonorrhea: NEGATIVE

## 2020-03-02 LAB — CULTURE, BETA STREP (GROUP B ONLY)
MICRO NUMBER:: 10571133
SPECIMEN QUALITY:: ADEQUATE

## 2020-03-02 LAB — OB RESULTS CONSOLE GBS: GBS: NEGATIVE

## 2020-03-04 ENCOUNTER — Encounter: Payer: Self-pay | Admitting: Obstetrics and Gynecology

## 2020-03-04 ENCOUNTER — Encounter (HOSPITAL_COMMUNITY): Payer: Self-pay | Admitting: Obstetrics & Gynecology

## 2020-03-04 ENCOUNTER — Inpatient Hospital Stay (HOSPITAL_COMMUNITY)
Admission: AD | Admit: 2020-03-04 | Discharge: 2020-03-04 | Disposition: A | Discharge status: Home / Self Care | Payer: 59 | Attending: Obstetrics & Gynecology | Admitting: Obstetrics & Gynecology

## 2020-03-04 ENCOUNTER — Ambulatory Visit (INDEPENDENT_AMBULATORY_CARE_PROVIDER_SITE_OTHER): Payer: 59 | Admitting: Obstetrics and Gynecology

## 2020-03-04 ENCOUNTER — Other Ambulatory Visit: Payer: Self-pay

## 2020-03-04 VITALS — BP 143/69 | HR 96 | Wt 226.0 lb

## 2020-03-04 DIAGNOSIS — Z8249 Family history of ischemic heart disease and other diseases of the circulatory system: Secondary | ICD-10-CM | POA: Insufficient documentation

## 2020-03-04 DIAGNOSIS — O26893 Other specified pregnancy related conditions, third trimester: Secondary | ICD-10-CM | POA: Diagnosis not present

## 2020-03-04 DIAGNOSIS — Z3689 Encounter for other specified antenatal screening: Secondary | ICD-10-CM

## 2020-03-04 DIAGNOSIS — Z0374 Encounter for suspected problem with fetal growth ruled out: Secondary | ICD-10-CM

## 2020-03-04 DIAGNOSIS — O133 Gestational [pregnancy-induced] hypertension without significant proteinuria, third trimester: Secondary | ICD-10-CM | POA: Insufficient documentation

## 2020-03-04 DIAGNOSIS — O0933 Supervision of pregnancy with insufficient antenatal care, third trimester: Secondary | ICD-10-CM | POA: Insufficient documentation

## 2020-03-04 DIAGNOSIS — Z3A38 38 weeks gestation of pregnancy: Secondary | ICD-10-CM

## 2020-03-04 DIAGNOSIS — R030 Elevated blood-pressure reading, without diagnosis of hypertension: Secondary | ICD-10-CM | POA: Diagnosis not present

## 2020-03-04 DIAGNOSIS — Z3403 Encounter for supervision of normal first pregnancy, third trimester: Secondary | ICD-10-CM

## 2020-03-04 LAB — COMPREHENSIVE METABOLIC PANEL
ALT: 12 U/L (ref 0–44)
AST: 16 U/L (ref 15–41)
Albumin: 2.9 g/dL — ABNORMAL LOW (ref 3.5–5.0)
Alkaline Phosphatase: 124 U/L (ref 38–126)
Anion gap: 10 (ref 5–15)
BUN: 5 mg/dL — ABNORMAL LOW (ref 6–20)
CO2: 21 mmol/L — ABNORMAL LOW (ref 22–32)
Calcium: 9.3 mg/dL (ref 8.9–10.3)
Chloride: 109 mmol/L (ref 98–111)
Creatinine, Ser: 0.56 mg/dL (ref 0.44–1.00)
GFR calc Af Amer: >60 mL/min (ref >60)
GFR calc non Af Amer: >60 mL/min (ref >60)
Glucose, Bld: 80 mg/dL (ref 70–99)
Potassium: 3.8 mmol/L (ref 3.5–5.1)
Sodium: 140 mmol/L (ref 135–145)
Total Bilirubin: 0.4 mg/dL (ref 0.3–1.2)
Total Protein: 5.9 g/dL — ABNORMAL LOW (ref 6.5–8.1)

## 2020-03-04 LAB — CBC
HCT: 34.4 % — ABNORMAL LOW (ref 36.0–46.0)
Hemoglobin: 11 g/dL — ABNORMAL LOW (ref 12.0–15.0)
MCH: 28.2 pg (ref 26.0–34.0)
MCHC: 32 g/dL (ref 30.0–36.0)
MCV: 88.2 fL (ref 80.0–100.0)
Platelets: 210 10*3/uL (ref 150–400)
RBC: 3.9 MIL/uL (ref 3.87–5.11)
RDW: 13.2 % (ref 11.5–15.5)
WBC: 10.2 10*3/uL (ref 4.0–10.5)
nRBC: 0 % (ref 0.0–0.2)

## 2020-03-04 LAB — URINALYSIS, ROUTINE W REFLEX MICROSCOPIC
Bilirubin Urine: NEGATIVE
Glucose, UA: NEGATIVE mg/dL
Hgb urine dipstick: NEGATIVE
Ketones, ur: NEGATIVE mg/dL
Leukocytes,Ua: NEGATIVE
Nitrite: NEGATIVE
Protein, ur: NEGATIVE mg/dL
Specific Gravity, Urine: 1.004 — ABNORMAL LOW (ref 1.005–1.030)
pH: 7 (ref 5.0–8.0)

## 2020-03-04 LAB — PROTEIN / CREATININE RATIO, URINE
Creatinine, Urine: 37.84 mg/dL
Total Protein, Urine: <6 mg/dL

## 2020-03-04 NOTE — MAU Note (Signed)
Sent from office for BP evaluation.  Denies H/A, visual disturbances, or epigastric pain.  Endorses +FM.  Denies VB or LOF.

## 2020-03-04 NOTE — MAU Provider Note (Addendum)
Chief Complaint:  BP Evaluation   First Provider Initiated Contact with Patient 03/04/20 1716     HPI: Rachel Marshall is a 24 y.o. G1P0 at [redacted]w[redacted]d who presents to maternity admissions referred by Dr. Earlene Plater after having an elevated BP at her prenatal appt at [facility]. Initial BP taken at 1459 was 143/69. Denies HA, visual changes/disturbances, dizziness, epigastric pain, LOF, contractions and VB.   Pregnancy Course: Late to [facility], otherwise unremarkable.  Past Medical History:  Diagnosis Date  . History of syncope   . Seizures (HCC)    one episode in 2012   OB History  Gravida Para Term Preterm AB Living  1            SAB TAB Ectopic Multiple Live Births               # Outcome Date GA Lbr Len/2nd Weight Sex Delivery Anes PTL Lv  1 Current            Past Surgical History:  Procedure Laterality Date  . WISDOM TOOTH EXTRACTION     Family History  Problem Relation Age of Onset  . Breast cancer Maternal Grandmother   . Hypertension Mother    Social History   Tobacco Use  . Smoking status: Never Smoker  . Smokeless tobacco: Never Used  Vaping Use  . Vaping Use: Never used  Substance Use Topics  . Alcohol use: Not Currently  . Drug use: Never   No Known Allergies Medications Prior to Admission  Medication Sig Dispense Refill Last Dose  . ADVANCED EVENING PRIMROSE OIL PO Take 2 capsules by mouth.   03/03/2020 at 1100  . Prenatal Vit-Fe Fumarate-FA (PRENATAL VITAMINS PO) Take by mouth.   03/03/2020 at 0900  . terconazole (TERAZOL 3) 0.8 % vaginal cream Place 1 applicator vaginally at bedtime.   03/02/2020 at 2215    I have reviewed patient's Past Medical Hx, Surgical Hx, Family Hx, Social Hx, medications and allergies.   ROS:  Review of Systems  Constitutional: Negative.   HENT: Negative.   Eyes: Negative.   Respiratory: Negative.   Cardiovascular: Positive for leg swelling.  Gastrointestinal: Negative.   Endocrine: Negative.   Genitourinary: Negative.    Musculoskeletal: Negative.   Skin: Negative.   Allergic/Immunologic: Negative.   Neurological: Negative.  Negative for dizziness, light-headedness and headaches.  Hematological: Negative.   Psychiatric/Behavioral: Negative.  Negative for confusion.   Physical Exam   Patient Vitals for the past 24 hrs:  BP Temp Temp src Pulse Resp SpO2 Height Weight  03/04/20 1845 131/67 -- -- 68 -- -- -- --  03/04/20 1830 125/62 -- -- 70 -- -- -- --  03/04/20 1815 134/83 -- -- 68 -- -- -- --  03/04/20 1800 130/66 -- -- 79 -- -- -- --  03/04/20 1745 135/75 -- -- 76 -- -- -- --  03/04/20 1730 126/72 -- -- 79 -- -- -- --  03/04/20 1715 133/72 -- -- 77 -- -- -- --  03/04/20 1709 140/71 -- -- 77 -- -- -- --  03/04/20 1638 136/82 98.6 F (37 C) Oral 86 20 99 % 5\' 9"  (1.753 m) 101.9 kg    Constitutional: Well-developed, well-nourished female in no acute distress.  Cardiovascular: normal rate & rhythm, no murmur Respiratory: normal effort, lung sounds clear throughout GI: Abd soft, non-tender, gravid appropriate for gestational age. Pos BS x 4 MS: Extremities nontender, +1 pitting edema on feet and up calves, non-edema present in hands, normal ROM Neurologic:  Alert and oriented x 4.  GU: No urinary frequency/urgency.     Fetal Tracing: Baseline: 140 Variability: present Accelerations: 15x15 Decelerations: None Toco: uterine irritability, no contractions palpated/felt by pt  Labs: Results for orders placed or performed during the hospital encounter of 03/04/20 (from the past 24 hour(s))  Urinalysis, Routine w reflex microscopic     Status: Abnormal   Collection Time: 03/04/20  5:05 PM  Result Value Ref Range   Color, Urine YELLOW YELLOW   APPearance HAZY (A) CLEAR   Specific Gravity, Urine 1.004 (L) 1.005 - 1.030   pH 7.0 5.0 - 8.0   Glucose, UA NEGATIVE NEGATIVE mg/dL   Hgb urine dipstick NEGATIVE NEGATIVE   Bilirubin Urine NEGATIVE NEGATIVE   Ketones, ur NEGATIVE NEGATIVE mg/dL    Protein, ur NEGATIVE NEGATIVE mg/dL   Nitrite NEGATIVE NEGATIVE   Leukocytes,Ua NEGATIVE NEGATIVE  Protein / creatinine ratio, urine     Status: None   Collection Time: 03/04/20  5:28 PM  Result Value Ref Range   Creatinine, Urine 37.84 mg/dL   Total Protein, Urine <6.0 mg/dL   Protein Creatinine Ratio        0.00 - 0.15 mg/mg[Cre]  Comprehensive metabolic panel     Status: Abnormal   Collection Time: 03/04/20  5:42 PM  Result Value Ref Range   Sodium 140 135 - 145 mmol/L   Potassium 3.8 3.5 - 5.1 mmol/L   Chloride 109 98 - 111 mmol/L   CO2 21 (L) 22 - 32 mmol/L   Glucose, Bld 80 70 - 99 mg/dL   BUN 5 (L) 6 - 20 mg/dL   Creatinine, Ser 6.38 0.44 - 1.00 mg/dL   Calcium 9.3 8.9 - 93.7 mg/dL   Total Protein 5.9 (L) 6.5 - 8.1 g/dL   Albumin 2.9 (L) 3.5 - 5.0 g/dL   AST 16 15 - 41 U/L   ALT 12 0 - 44 U/L   Alkaline Phosphatase 124 38 - 126 U/L   Total Bilirubin 0.4 0.3 - 1.2 mg/dL   GFR calc non Af Amer >60 >60 mL/min   GFR calc Af Amer >60 >60 mL/min   Anion gap 10 5 - 15  CBC     Status: Abnormal   Collection Time: 03/04/20  5:42 PM  Result Value Ref Range   WBC 10.2 4.0 - 10.5 K/uL   RBC 3.90 3.87 - 5.11 MIL/uL   Hemoglobin 11.0 (L) 12.0 - 15.0 g/dL   HCT 34.2 (L) 36 - 46 %   MCV 88.2 80.0 - 100.0 fL   MCH 28.2 26.0 - 34.0 pg   MCHC 32.0 30.0 - 36.0 g/dL   RDW 87.6 81.1 - 57.2 %   Platelets 210 150 - 400 K/uL   nRBC 0.0 0.0 - 0.2 %   MAU Course: Orders Placed This Encounter  Procedures  . Urinalysis, Routine w reflex microscopic  . Comprehensive metabolic panel  . CBC  . Protein / creatinine ratio, urine   No orders of the defined types were placed in this encounter.  MDM: CBC, CMP, urine pro/cre ordered, will monitor BP. Signed over to Donette Larry, CNM at [phone].  Bernerd Limbo, CNM 03/04/2020 5:30 PM  No evidence of PEC, does not meet criteria for gHTN. Will arrange close f/u and discussed strict PEC precautions. Stable for discharge home.  A/P: [redacted]  weeks gestation Reactive NST Transient HTN Discharge home Follow up at [facility] this week for BP check PEC precautions  Allergies as of  03/04/2020   No Known Allergies     Medication List    TAKE these medications   ADVANCED EVENING PRIMROSE OIL PO Take 2 capsules by mouth.   PRENATAL VITAMINS PO Take by mouth.   terconazole 0.8 % vaginal cream Commonly known as: TERAZOL 3 Place 1 applicator vaginally at bedtime.       Julianne Handler, CNM  03/04/2020 6:10 PM

## 2020-03-04 NOTE — Progress Notes (Signed)
   PRENATAL VISIT NOTE  Subjective:  Rachel Marshall is a 24 y.o. G1P0 at [redacted]w[redacted]d being seen today for ongoing prenatal care.  She is currently monitored for the following issues for this low-risk pregnancy and has Supervision of normal pregnancy; Late prenatal care; and Fetal growth problem suspected but not found on their problem list.  Patient reports occasional cramping.  Contractions: Not present. Vag. Bleeding: None.  Movement: Present. Denies leaking of fluid.   The following portions of the patient's history were reviewed and updated as appropriate: allergies, current medications, past family history, past medical history, past social history, past surgical history and problem list.   Objective:   Vitals:   03/04/20 1459  BP: (!) 143/69  Pulse: 96  Weight: 226 lb (102.5 kg)    Fetal Status: Fetal Heart Rate (bpm): 155   Movement: Present     General:  Alert, oriented and cooperative. Patient is in no acute distress.  Skin: Skin is warm and dry. No rash noted.   Cardiovascular: Normal heart rate noted  Respiratory: Normal respiratory effort, no problems with respiration noted  Abdomen: Soft, gravid, appropriate for gestational age.  Pain/Pressure: Present     Pelvic: Cervical exam deferred        Extremities: Normal range of motion.  Edema: Mild pitting, slight indentation  Mental Status: Normal mood and affect. Normal behavior. Normal judgment and thought content.   Assessment and Plan:  Pregnancy: G1P0 at [redacted]w[redacted]d  1. Encounter for supervision of normal first pregnancy in third trimester No complaints Elevated BP in office, to MAU for eval  Term labor symptoms and general obstetric precautions including but not limited to vaginal bleeding, contractions, leaking of fluid and fetal movement were reviewed in detail with the patient. Please refer to After Visit Summary for other counseling recommendations.   Return in about 1 week (around 03/11/2020) for low OB, in  person.  Future Appointments  Date Time Provider Department Center  03/15/2020 10:30 AM Donette Larry, CNM [facility]    Conan Bowens, MD

## 2020-03-04 NOTE — Discharge Instructions (Signed)

## 2020-03-05 ENCOUNTER — Telehealth: Payer: Self-pay | Admitting: *Deleted

## 2020-03-05 NOTE — Telephone Encounter (Signed)
Left patient an urgent message to call and schedule BP check for Friday, March 08, 2020.

## 2020-03-05 NOTE — Telephone Encounter (Signed)
-----   Message from Donette Larry, [place] sent at 03/04/2020  6:56 PM EDT ----- Regarding: BP check Seen in MAU, had elevated BP. Please schedule for BP check this week. Thanks!!

## 2020-03-08 ENCOUNTER — Ambulatory Visit (INDEPENDENT_AMBULATORY_CARE_PROVIDER_SITE_OTHER): Payer: 59 | Admitting: Obstetrics and Gynecology

## 2020-03-08 ENCOUNTER — Other Ambulatory Visit: Payer: Self-pay

## 2020-03-08 ENCOUNTER — Inpatient Hospital Stay (HOSPITAL_COMMUNITY)
Admit: 2020-03-08 | Discharge: 2020-03-11 | DRG: 807 | Disposition: A | Discharge status: Home / Self Care | Payer: 59 | Attending: Obstetrics and Gynecology | Admitting: Obstetrics and Gynecology

## 2020-03-08 ENCOUNTER — Encounter (HOSPITAL_COMMUNITY): Payer: Self-pay | Admitting: Obstetrics & Gynecology

## 2020-03-08 VITALS — BP 142/77 | HR 103 | Wt 225.0 lb

## 2020-03-08 DIAGNOSIS — O134 Gestational [pregnancy-induced] hypertension without significant proteinuria, complicating childbirth: Secondary | ICD-10-CM | POA: Diagnosis present

## 2020-03-08 DIAGNOSIS — O133 Gestational [pregnancy-induced] hypertension without significant proteinuria, third trimester: Secondary | ICD-10-CM

## 2020-03-08 DIAGNOSIS — Z3403 Encounter for supervision of normal first pregnancy, third trimester: Secondary | ICD-10-CM

## 2020-03-08 DIAGNOSIS — O0930 Supervision of pregnancy with insufficient antenatal care, unspecified trimester: Secondary | ICD-10-CM

## 2020-03-08 DIAGNOSIS — Z3A39 39 weeks gestation of pregnancy: Secondary | ICD-10-CM | POA: Diagnosis not present

## 2020-03-08 DIAGNOSIS — Z20822 Contact with and (suspected) exposure to covid-19: Secondary | ICD-10-CM | POA: Diagnosis present

## 2020-03-08 DIAGNOSIS — Z3A38 38 weeks gestation of pregnancy: Secondary | ICD-10-CM

## 2020-03-08 DIAGNOSIS — O4202 Full-term premature rupture of membranes, onset of labor within 24 hours of rupture: Secondary | ICD-10-CM | POA: Diagnosis not present

## 2020-03-08 DIAGNOSIS — Z0374 Encounter for suspected problem with fetal growth ruled out: Secondary | ICD-10-CM | POA: Diagnosis present

## 2020-03-08 LAB — TYPE AND SCREEN
ABO/RH(D): B POS
Antibody Screen: NEGATIVE

## 2020-03-08 LAB — CBC
HCT: 33.9 % — ABNORMAL LOW (ref 36.0–46.0)
Hemoglobin: 11 g/dL — ABNORMAL LOW (ref 12.0–15.0)
MCH: 28.1 pg (ref 26.0–34.0)
MCHC: 32.4 g/dL (ref 30.0–36.0)
MCV: 86.5 fL (ref 80.0–100.0)
Platelets: 213 10*3/uL (ref 150–400)
RBC: 3.92 MIL/uL (ref 3.87–5.11)
RDW: 13.2 % (ref 11.5–15.5)
WBC: 8.5 10*3/uL (ref 4.0–10.5)
nRBC: 0 % (ref 0.0–0.2)

## 2020-03-08 LAB — COMPREHENSIVE METABOLIC PANEL
ALT: 12 U/L (ref 0–44)
AST: 20 U/L (ref 15–41)
Albumin: 3.1 g/dL — ABNORMAL LOW (ref 3.5–5.0)
Alkaline Phosphatase: 128 U/L — ABNORMAL HIGH (ref 38–126)
Anion gap: 10 (ref 5–15)
BUN: <5 mg/dL — ABNORMAL LOW (ref 6–20)
CO2: 21 mmol/L — ABNORMAL LOW (ref 22–32)
Calcium: 9.3 mg/dL (ref 8.9–10.3)
Chloride: 108 mmol/L (ref 98–111)
Creatinine, Ser: 0.52 mg/dL (ref 0.44–1.00)
GFR calc Af Amer: >60 mL/min (ref >60)
GFR calc non Af Amer: >60 mL/min (ref >60)
Glucose, Bld: 77 mg/dL (ref 70–99)
Potassium: 3.5 mmol/L (ref 3.5–5.1)
Sodium: 139 mmol/L (ref 135–145)
Total Bilirubin: 0.4 mg/dL (ref 0.3–1.2)
Total Protein: 6.1 g/dL — ABNORMAL LOW (ref 6.5–8.1)

## 2020-03-08 LAB — PROTEIN / CREATININE RATIO, URINE
Creatinine, Urine: 111.64 mg/dL
Protein Creatinine Ratio: 0.17 mg/mg{Cre} — ABNORMAL HIGH (ref 0.00–0.15)
Total Protein, Urine: 19 mg/dL

## 2020-03-08 LAB — SARS CORONAVIRUS 2 BY RT PCR (HOSPITAL ORDER, PERFORMED IN ~~LOC~~ HOSPITAL LAB): SARS Coronavirus 2: NEGATIVE

## 2020-03-08 LAB — ABO/RH: ABO/RH(D): B POS

## 2020-03-08 MED ORDER — ACETAMINOPHEN 325 MG PO TABS: 650.0000 mg | ORAL_TABLET | ORAL | Status: DC | PRN

## 2020-03-08 MED ORDER — LIDOCAINE HCL (PF) 1 % IJ SOLN: 30.0000 mL | INTRAMUSCULAR | Status: DC | PRN

## 2020-03-08 MED ORDER — OXYCODONE-ACETAMINOPHEN 5-325 MG PO TABS
2.0000 | ORAL_TABLET | ORAL | Status: DC | PRN
  Filled 2020-03-08: qty 2

## 2020-03-08 MED ORDER — OXYTOCIN-SODIUM CHLORIDE 30-0.9 UT/500ML-% IV SOLN
1.0000 m[IU]/min | INTRAVENOUS | Status: DC
  Administered 2020-03-08: 2 m[IU]/min via INTRAVENOUS
  Filled 2020-03-08: qty 500

## 2020-03-08 MED ORDER — LACTATED RINGERS IV SOLN: 500.0000 mL | INTRAVENOUS | Status: DC | PRN

## 2020-03-08 MED ORDER — OXYTOCIN BOLUS FROM INFUSION
333.0000 mL | Freq: Once | INTRAVENOUS | Status: AC
  Administered 2020-03-09: 333 mL via INTRAVENOUS

## 2020-03-08 MED ORDER — LACTATED RINGERS IV SOLN: INTRAVENOUS | Status: DC

## 2020-03-08 MED ORDER — SOD CITRATE-CITRIC ACID 500-334 MG/5ML PO SOLN: 30.0000 mL | ORAL | Status: DC | PRN

## 2020-03-08 MED ORDER — TERBUTALINE SULFATE 1 MG/ML IJ SOLN: 0.2500 mg | Freq: Once | INTRAMUSCULAR | Status: DC | PRN

## 2020-03-08 MED ORDER — OXYCODONE-ACETAMINOPHEN 5-325 MG PO TABS
1.0000 | ORAL_TABLET | ORAL | Status: DC | PRN
  Administered 2020-03-08 (×2): 1 via ORAL

## 2020-03-08 MED ORDER — MISOPROSTOL 25 MCG QUARTER TABLET
ORAL_TABLET | ORAL | Status: AC
  Filled 2020-03-08: qty 1

## 2020-03-08 MED ORDER — ONDANSETRON HCL 4 MG/2ML IJ SOLN: 4.0000 mg | Freq: Four times a day (QID) | INTRAMUSCULAR | Status: DC | PRN

## 2020-03-08 MED ORDER — OXYTOCIN-SODIUM CHLORIDE 30-0.9 UT/500ML-% IV SOLN: 2.5000 [IU]/h | INTRAVENOUS | Status: DC

## 2020-03-08 MED ORDER — MISOPROSTOL 25 MCG QUARTER TABLET
25.0000 ug | ORAL_TABLET | ORAL | Status: DC | PRN
  Administered 2020-03-08: 25 ug via VAGINAL

## 2020-03-08 NOTE — Progress Notes (Signed)
   PRENATAL VISIT NOTE  Subjective:  Rachel Marshall is a 24 y.o. G1P0 at [redacted]w[redacted]d being seen today for ongoing prenatal care.  She is currently monitored for the following issues for this low-risk pregnancy and has Supervision of normal pregnancy; Late prenatal care; and Fetal growth problem suspected but not found on their problem list.  Patient reports Leaking of fluid since last evening. .  Contractions: Irritability. Vag. Bleeding: None.  Movement: Present. Denies leaking of fluid.   The following portions of the patient's history were reviewed and updated as appropriate: allergies, current medications, past family history, past medical history, past social history, past surgical history and problem list.   Objective:   Vitals:   03/08/20 0841  BP: (!) 142/77  Pulse: (!) 103  Weight: 225 lb (102.1 kg)    Fetal Status: Fetal Heart Rate (bpm): 145   Movement: Present     General:  Alert, oriented and cooperative. Patient is in no acute distress.  Skin: Skin is warm and dry. No rash noted.   Cardiovascular: Normal heart rate noted  Respiratory: Normal respiratory effort, no problems with respiration noted  Abdomen: Soft, gravid, appropriate for gestational age.  Pain/Pressure: Present     Pelvic: Cervical exam deferred      Speculum exam: no pooling of fluid, cervix appears closed, large amount of mucoid discharge at cervix.   Extremities: Normal range of motion.  Edema: Mild pitting, slight indentation  Mental Status: Normal mood and affect. Normal behavior. Normal judgment and thought content.   Assessment and Plan:  Pregnancy: G1P0 at [redacted]w[redacted]d  1. Gestational hypertension, third trimester  BP elevated 6/14 & 6/18- No severe range BP readings  Meets criteria for GHTN @ 38.6w Notified [place] Deputy of direct admit to labor and delivery.  Patient without HA or scotoma.  Orders placed.    Term labor symptoms and general obstetric precautions including but not limited to vaginal  bleeding, contractions, leaking of fluid and fetal movement were reviewed in detail with the patient. Please refer to After Visit Summary for other counseling recommendations.   No follow-ups on file.  No future appointments.  Venia Carbon, NP

## 2020-03-08 NOTE — Progress Notes (Signed)
Pt here for BP check and thinks she may be leaking fluid

## 2020-03-08 NOTE — Progress Notes (Signed)
Patient ID: Rachel Marshall, female   DOB: April 06, 1996, 24 y.o.   MRN: [id number]  Foley came out earlier; feels well; s/p cytotec x 1 dose 4hrs ago  BP 138/85, 144/81, P 100 FHR 130-140s, +accels, occ variables, Cat 1 Ctx irreg, mild Cx 4-5/80/vtx -2  IUP@38 .6wks gHTN- BPs stable Cx favorable  Begin Pit per low-dose protocol and titrate to achieve active labor Urine P/C ratio pending  Arabella Merles [facility] 03/08/2020 4:52 PM

## 2020-03-08 NOTE — Progress Notes (Signed)
LABOR PROGRESS NOTE  Rachel Marshall is a 24 y.o. G1P0 at 108w6d  admitted for IOL in setting of GHTN.  Subjective: Patient is doing well, without complaints and comfortably resting in bed with family members at bedside.   Objective: BP 129/68   Pulse 80   Temp 97.6 F (36.4 C) (Oral)   Resp 18   Ht 5\' 9"  (1.753 m)   Wt 102.1 kg   BMI 33.23 kg/m  or  Vitals:   03/08/20 1926 03/08/20 2005 03/08/20 2036 03/08/20 2105  BP: (!) 142/75 134/71 138/76 129/68  Pulse: 85 80 71 80  Resp: 17 18 18 18   Temp:      TempSrc:      Weight:      Height:       Dilation: 4.5 Effacement (%): 80 Station: -3 Presentation: Vertex Exam by:: CNM FHT: baseline rate 125, marked varibility, +acel, no decel Toco: every 2-3 mins   Labs: Lab Results  Component Value Date   WBC 8.5 03/08/2020   HGB 11.0 (L) 03/08/2020   HCT 33.9 (L) 03/08/2020   MCV 86.5 03/08/2020   PLT 213 03/08/2020    Patient Active Problem List   Diagnosis Date Noted  . Gestational hypertension, third trimester 03/08/2020  . Fetal growth problem suspected but not found 01/05/2020  . Supervision of normal pregnancy 11/21/2019  . Late prenatal care 11/21/2019    Assessment / Plan: 24 y.o. G1P0 at [redacted]w[redacted]d here for IOL in setting of GHTN.  Labor: induced with cytotec, s/p FB, now on pitocin at 10, will continue pitocin and complete cervical exam as patient as contractions increase  Fetal Wellbeing:  Category I  Pain Control:  Labor support without medications  GBS: negative  Anticipated MOD:  Vaginal delivery    30, MD Family Medicine, PGY-1  03/08/2020, 9:40 PM

## 2020-03-08 NOTE — H&P (Signed)
Rachel Marshall is a 24 y.o. female G1 @ 38.6wks by 18wk u/s presenting for IOL due gHTN. Her BPs were elevated for the second time in the office today, but she denies H/A, visual disturbances, or RUQ pain. Reports significant BLE. No bleeding, leaking, or ctx. Her preg has been followed by the [facility] office and has been remarkable for:  # onset of PNC at 17wks in [place] with one visit prior to tx at 23.3wks to [facility] # measured S<D with most recent EFW 53rd%   OB History    Gravida  1   Para      Term      Preterm      AB      Living        SAB      TAB      Ectopic      Multiple      Live Births             Past Medical History:  Diagnosis Date  . History of syncope   . Orthostatic hypotension 2020  . Seizures (HCC)    one episode in 2012   Past Surgical History:  Procedure Laterality Date  . WISDOM TOOTH EXTRACTION     Family History: family history includes Breast cancer in her maternal grandmother; Hypertension in her mother. Social History:  reports that she has never smoked. She has never used smokeless tobacco. She reports previous alcohol use. She reports that she does not use drugs.     Maternal Diabetes: No Genetic Screening: Declined Maternal Ultrasounds/Referrals: Normal Fetal Ultrasounds or other Referrals:  None Maternal Substance Abuse:  No Significant Maternal Medications:  None Significant Maternal Lab Results:  Group B Strep negative Other Comments:  None  Review of Systems History Dilation: 1.5 Effacement (%): 80 Station: -3 Exam by:: K Raesean Bartoletti CNM Blood pressure (!) 144/81, pulse (!) 105. Maternal Exam:  Introitus: Normal vulva.   Physical Exam  Vitals reviewed. HENT:  Head: Normocephalic.  Cardiovascular: Normal rate.  Respiratory: Effort normal.  GI: Normal appearance.  EFM 150s, +accels, no decels, occ variables Ctx irreg, mild Cx 1+/80/vtx -3    Genitourinary:    Vulva normal.   Musculoskeletal:     Right lower  leg: Edema present.     Left lower leg: Edema present.  Neurological: She is alert.  Skin: Skin is warm and dry.  Psychiatric: Her behavior is normal. Mood normal.    Prenatal labs: ABO, Rh: --/--/B POS, B POS Performed at [facility], [address], [place], [place] [address]  [phone]) Antibody: NEG (06/18 1201) Rubella:  Immune RPR: NON-REACTIVE (03/26 0819)  HBsAg:   Neg HIV: NON-REACTIVE (03/26 0819)  GBS:   neg (02/28/20)  CBC    Component Value Date/Time   WBC 8.5 03/08/2020 1203   RBC 3.92 03/08/2020 1203   HGB 11.0 (L) 03/08/2020 1203   HCT 33.9 (L) 03/08/2020 1203   PLT 213 03/08/2020 1203   MCV 86.5 03/08/2020 1203   MCH 28.1 03/08/2020 1203   MCHC 32.4 03/08/2020 1203   RDW 13.2 03/08/2020 1203   CMP     Component Value Date/Time   NA 139 03/08/2020 1203   K 3.5 03/08/2020 1203   CL 108 03/08/2020 1203   CO2 21 (L) 03/08/2020 1203   GLUCOSE 77 03/08/2020 1203   BUN <5 (L) 03/08/2020 1203   CREATININE 0.52 03/08/2020 1203   CALCIUM 9.3 03/08/2020 1203   PROT  6.1 (L) 03/08/2020 1203   ALBUMIN 3.1 (L) 03/08/2020 1203   AST 20 03/08/2020 1203   ALT 12 03/08/2020 1203   ALKPHOS 128 (H) 03/08/2020 1203   BILITOT 0.4 03/08/2020 1203   GFRNONAA >60 03/08/2020 1203   GFRAA >60 03/08/2020 1203   Urine P/C ratio: pending  Assessment/Plan: IUP@38 .6wks New dx gHTN- asymptomatic, nonsevere range BPs Cx unfavorable GBS neg  Admit to Labor and Delivery Plan cx ripening with foley and cytotec to start, followed by Pitocin and AROM prn Continue to watch BPs Anticipate vag del  Myrtis Ser CNM 03/08/2020, 2:22 PM

## 2020-03-09 ENCOUNTER — Inpatient Hospital Stay (HOSPITAL_COMMUNITY): Payer: 59 | Admitting: Anesthesiology

## 2020-03-09 ENCOUNTER — Encounter (HOSPITAL_COMMUNITY): Payer: Self-pay | Admitting: Obstetrics & Gynecology

## 2020-03-09 DIAGNOSIS — Z3A39 39 weeks gestation of pregnancy: Secondary | ICD-10-CM

## 2020-03-09 DIAGNOSIS — O4202 Full-term premature rupture of membranes, onset of labor within 24 hours of rupture: Secondary | ICD-10-CM

## 2020-03-09 DIAGNOSIS — O134 Gestational [pregnancy-induced] hypertension without significant proteinuria, complicating childbirth: Secondary | ICD-10-CM

## 2020-03-09 LAB — CBC
HCT: 34.7 % — ABNORMAL LOW (ref 36.0–46.0)
Hemoglobin: 11.3 g/dL — ABNORMAL LOW (ref 12.0–15.0)
MCH: 28.6 pg (ref 26.0–34.0)
MCHC: 32.6 g/dL (ref 30.0–36.0)
MCV: 87.8 fL (ref 80.0–100.0)
Platelets: 203 10*3/uL (ref 150–400)
RBC: 3.95 MIL/uL (ref 3.87–5.11)
RDW: 13.1 % (ref 11.5–15.5)
WBC: 13.8 10*3/uL — ABNORMAL HIGH (ref 4.0–10.5)
nRBC: 0 % (ref 0.0–0.2)

## 2020-03-09 LAB — RPR: RPR Ser Ql: NONREACTIVE

## 2020-03-09 MED ORDER — DIPHENHYDRAMINE HCL 25 MG PO CAPS: 25.0000 mg | ORAL_CAPSULE | Freq: Four times a day (QID) | ORAL | Status: DC | PRN

## 2020-03-09 MED ORDER — BENZOCAINE-MENTHOL 20-0.5 % EX AERO
1.0000 "application " | INHALATION_SPRAY | CUTANEOUS | Status: DC | PRN
  Administered 2020-03-09: 1 via TOPICAL
  Filled 2020-03-09: qty 56

## 2020-03-09 MED ORDER — PHENYLEPHRINE 40 MCG/ML (10ML) SYRINGE FOR IV PUSH (FOR BLOOD PRESSURE SUPPORT): 80.0000 ug | PREFILLED_SYRINGE | INTRAVENOUS | Status: DC | PRN

## 2020-03-09 MED ORDER — PHENYLEPHRINE 40 MCG/ML (10ML) SYRINGE FOR IV PUSH (FOR BLOOD PRESSURE SUPPORT)
80.0000 ug | PREFILLED_SYRINGE | INTRAVENOUS | Status: DC | PRN
  Filled 2020-03-09: qty 10

## 2020-03-09 MED ORDER — TETANUS-DIPHTH-ACELL PERTUSSIS 5-2.5-18.5 LF-MCG/0.5 IM SUSP: 0.5000 mL | Freq: Once | INTRAMUSCULAR | Status: DC

## 2020-03-09 MED ORDER — IBUPROFEN 600 MG PO TABS
600.0000 mg | ORAL_TABLET | Freq: Three times a day (TID) | ORAL | Status: DC | PRN
  Administered 2020-03-09 – 2020-03-11 (×4): 600 mg via ORAL
  Filled 2020-03-09 (×4): qty 1

## 2020-03-09 MED ORDER — ACETAMINOPHEN 325 MG PO TABS: 650.0000 mg | ORAL_TABLET | Freq: Four times a day (QID) | ORAL | Status: DC | PRN

## 2020-03-09 MED ORDER — SENNOSIDES-DOCUSATE SODIUM 8.6-50 MG PO TABS
2.0000 | ORAL_TABLET | ORAL | Status: DC
  Administered 2020-03-09 – 2020-03-10 (×2): 2 via ORAL
  Filled 2020-03-09 (×2): qty 2

## 2020-03-09 MED ORDER — LACTATED RINGERS IV SOLN
500.0000 mL | Freq: Once | INTRAVENOUS | Status: DC
Start: 2020-03-09 — End: 2020-03-09

## 2020-03-09 MED ORDER — DIPHENHYDRAMINE HCL 50 MG/ML IJ SOLN: 12.5000 mg | INTRAMUSCULAR | Status: DC | PRN

## 2020-03-09 MED ORDER — EPHEDRINE 5 MG/ML INJ: 10.0000 mg | INTRAVENOUS | Status: DC | PRN

## 2020-03-09 MED ORDER — DIBUCAINE (PERIANAL) 1 % EX OINT: 1.0000 "application " | TOPICAL_OINTMENT | CUTANEOUS | Status: DC | PRN

## 2020-03-09 MED ORDER — ZOLPIDEM TARTRATE 5 MG PO TABS: 5.0000 mg | ORAL_TABLET | Freq: Every evening | ORAL | Status: DC | PRN

## 2020-03-09 MED ORDER — FENTANYL-BUPIVACAINE-NACL 0.5-0.125-0.9 MG/250ML-% EP SOLN
12.0000 mL/h | EPIDURAL | Status: DC | PRN
  Filled 2020-03-09: qty 250

## 2020-03-09 MED ORDER — WITCH HAZEL-GLYCERIN EX PADS: 1.0000 "application " | MEDICATED_PAD | CUTANEOUS | Status: DC | PRN

## 2020-03-09 MED ORDER — LACTATED RINGERS IV SOLN
500.0000 mL | Freq: Once | INTRAVENOUS | Status: AC
  Administered 2020-03-09: 500 mL via INTRAVENOUS

## 2020-03-09 MED ORDER — ONDANSETRON HCL 4 MG/2ML IJ SOLN: 4.0000 mg | INTRAMUSCULAR | Status: DC | PRN

## 2020-03-09 MED ORDER — CALCIUM CARBONATE ANTACID 500 MG PO CHEW
200.0000 mg | CHEWABLE_TABLET | Freq: Three times a day (TID) | ORAL | Status: DC | PRN
  Administered 2020-03-09: 200 mg via ORAL
  Filled 2020-03-09: qty 1

## 2020-03-09 MED ORDER — FENTANYL-BUPIVACAINE-NACL 0.5-0.125-0.9 MG/250ML-% EP SOLN
12.0000 mL/h | EPIDURAL | Status: DC | PRN
Start: 2020-03-09 — End: 2020-03-09

## 2020-03-09 MED ORDER — MEASLES, MUMPS & RUBELLA VAC IJ SOLR: 0.5000 mL | Freq: Once | INTRAMUSCULAR | Status: DC

## 2020-03-09 MED ORDER — ONDANSETRON HCL 4 MG PO TABS: 4.0000 mg | ORAL_TABLET | ORAL | Status: DC | PRN

## 2020-03-09 MED ORDER — SODIUM CHLORIDE (PF) 0.9 % IJ SOLN
INTRAMUSCULAR | Status: DC | PRN
  Administered 2020-03-09: 12 mL/h via EPIDURAL

## 2020-03-09 MED ORDER — SIMETHICONE 80 MG PO CHEW: 80.0000 mg | CHEWABLE_TABLET | ORAL | Status: DC | PRN

## 2020-03-09 MED ORDER — PRENATAL MULTIVITAMIN CH
1.0000 | ORAL_TABLET | Freq: Every day | ORAL | Status: DC
  Administered 2020-03-09 – 2020-03-11 (×3): 1 via ORAL
  Filled 2020-03-09 (×2): qty 1

## 2020-03-09 MED ORDER — COCONUT OIL OIL
1.0000 "application " | TOPICAL_OIL | Status: DC | PRN
  Administered 2020-03-10: 1 via TOPICAL

## 2020-03-09 MED ORDER — LIDOCAINE HCL (PF) 1 % IJ SOLN
INTRAMUSCULAR | Status: DC | PRN
  Administered 2020-03-09: 5 mL via EPIDURAL

## 2020-03-09 NOTE — Anesthesia Preprocedure Evaluation (Signed)
Anesthesia Evaluation  Patient identified by MRN, date of birth, ID band Patient awake    Reviewed: Allergy & Precautions, Patient's Chart, lab work & pertinent test results  Airway Mallampati: II       Dental no notable dental hx.    Pulmonary    Pulmonary exam normal        Cardiovascular hypertension, Normal cardiovascular exam     Neuro/Psych Seizures -, Well Controlled,     GI/Hepatic   Endo/Other    Renal/GU      Musculoskeletal   Abdominal   Peds  Hematology   Anesthesia Other Findings   Reproductive/Obstetrics (+) Pregnancy                             Anesthesia Physical Anesthesia Plan  ASA: II  Anesthesia Plan: Epidural   Post-op Pain Management:    Induction:   PONV Risk Score and Plan: 0  Airway Management Planned: Natural Airway  Additional Equipment: None  Intra-op Plan:   Post-operative Plan:   Informed Consent: I have reviewed the patients History and Physical, chart, labs and discussed the procedure including the risks, benefits and alternatives for the proposed anesthesia with the patient or authorized representative who has indicated his/her understanding and acceptance.       Plan Discussed with:   Anesthesia Plan Comments: (Lab Results      Component                Value               Date                      WBC                      13.8 (H)            03/09/2020                HGB                      11.3 (L)            03/09/2020                HCT                      34.7 (L)            03/09/2020                MCV                      87.8                03/09/2020                PLT                      203                 03/09/2020           )        Anesthesia Quick Evaluation

## 2020-03-09 NOTE — Progress Notes (Signed)
Patient ID: Rachel Marshall, female   DOB: 08-06-1996, 24 y.o.   MRN: [id number]  Mostly comfortable with epidural; on left side with peanut  108/49, 120/51, P 79 FHR 150s, +accels, occ variables Ctx q 3-5 mins with Pit at 6mu/min Cx 9/C/vtx +1  IUP@39 .0wks gHTN- stable Active labor/transition  Check cx in next 1-2 hrs; begin pushing if complete and with urge Anticipate vag del  Rachel Marshall CNM 7:05 AM 03/09/2020

## 2020-03-09 NOTE — Progress Notes (Signed)
Patient ID: Rachel Marshall, female   DOB: 17-Jul-1996, 24 y.o.   MRN: [id number]  Rachel Marshall is a 24 y.o. G1P0 at [redacted]w[redacted]d admitted for IOL for gHTN.  Subjective: Sleeping comfortably.  Has peanut ball between legs.   Objective: BP 97/69   Pulse 89   Temp 98.5 F (36.9 C) (Oral)   Resp 16   Ht 5\' 9"  (1.753 m)   Wt 102.1 kg   SpO2 99%   BMI 33.23 kg/m  No intake/output data recorded.  FHT:  FHR: 140 bpm, variability: moderate,  accelerations:  Present,  decelerations:  Present some earlies UC:   regular, every 2-3 minutes  SVE:   Dilation: 9 Effacement (%): 100 Station: Plus 1 Exam by:: [email or web address] CNM  Pitocin @ 14 mu/min  Labs: Lab Results  Component Value Date   WBC 13.8 (H) 03/09/2020   HGB 11.3 (L) 03/09/2020   HCT 34.7 (L) 03/09/2020   MCV 87.8 03/09/2020   PLT 203 03/09/2020    Assessment / Plan: 24 y.o. G1P0 at [redacted]w[redacted]d admitted for IOL for gHTN.  Labor: Progressing well.  Resting comfortably now.  Will push when she is awake and feeling more uncomfortable/the urge to push. Fetal Wellbeing:  Category I Pain Control:  Epidural GHTN: Well-controlled BP  I/D:  Negative Anticipated MOD:  SVD    Trameka Dorough [redacted]w[redacted]d, MD PGY-2 Resident Family Medicine 03/09/2020, 8:48 AM

## 2020-03-09 NOTE — Discharge Summary (Signed)
Postpartum Discharge Summary      Patient Name: Rachel Marshall DOB: Feb 08, 1996 MRN: [id number]  Date of admission: 03/08/2020 Delivery date:03/09/2020  Delivering provider: Lenna Sciara  Date of discharge: 03/11/2020  Admitting diagnosis: Gestational hypertension, third trimester [O13.3] Intrauterine pregnancy: [redacted]w[redacted]d    Secondary diagnosis:  Active Problems:   Late prenatal care   Fetal growth problem suspected but not found   Gestational hypertension, third trimester  Additional problems: None    Discharge diagnosis: Term Pregnancy Delivered and Gestational Hypertension                                              Post partum procedures:None Augmentation: Pitocin, Cytotec and IP Foley Complications: None  Hospital course: Induction of Labor With Vaginal Delivery   24y.o. yo G1P1001 at 385w0das admitted to the hospital 03/08/2020 for induction of labor.  Indication for induction: Gestational hypertension.  Patient had an uncomplicated labor course as follows: Received Cytotec and Foley bulb was placed for cervical ripening. SROM occurred. Started on pitocin. Progressed to complete without complication. Membrane Rupture Time/Date: 4:13 AM ,03/09/2020   Delivery Method:Vaginal, Spontaneous  Episiotomy: None  Lacerations:  Labial  Details of delivery can be found in separate delivery note. BP's monitored PP and were borderline elevated so patient started on procardia 30 mg XL on day of discharge. Patient had a routine postpartum course. Of note, baby had pneumothorax this am and is in NICU, patient tearful when discussing infant but otherwise appropriate and ready for discharge. Patient is discharged home 03/11/20.  Newborn Data: Birth date:03/09/2020  Birth time:10:10 AM  Gender:Female  Living status:Living  Apgars:8 ,8  Weight:3990 g   Magnesium Sulfate received: No BMZ received: No Rhophylac:N/A MMR:N/A T-DaP:Given prenatally Flu: No Transfusion:No  Physical exam   Vitals:   03/10/20 1933 03/10/20 2307 03/11/20 0459 03/11/20 0748  BP: 136/84 134/74 133/78 125/63  Pulse: 80 64 87 92  Resp: _0 Temp: 97.6 F (36.4 C) 97.9 F (36.6 C) 98 F (36.7 C) 97.9 F (36.6 C)  TempSrc: Oral Oral Oral Oral  SpO2: 98% 100% 100% 99%  Weight:      Height:       General: alert and cooperative, mild distress, tearful when discussing baby Lochia: appropriate Uterine Fundus: firm Incision: N/A DVT Evaluation: No evidence of DVT seen on physical exam. Labs: Lab Results  Component Value Date   WBC 13.8 (H) 03/09/2020   HGB 11.3 (L) 03/09/2020   HCT 34.7 (L) 03/09/2020   MCV 87.8 03/09/2020   PLT 203 03/09/2020   CMP Latest Ref Rng & Units 03/08/2020  Glucose 70 - 99 mg/dL 77  BUN 6 - 20 mg/dL <5(L)  Creatinine 0.44 - 1.00 mg/dL 0.52  Sodium 135 - 145 mmol/L 139  Potassium 3.5 - 5.1 mmol/L 3.5  Chloride 98 - 111 mmol/L 108  CO2 22 - 32 mmol/L 21(L)  Calcium 8.9 - 10.3 mg/dL 9.3  Total Protein 6.5 - 8.1 g/dL 6.1(L)  Total Bilirubin 0.3 - 1.2 mg/dL 0.4  Alkaline Phos 38 - 126 U/L 128(H)  AST 15 - 41 U/L 20  ALT 0 - 44 U/L 12   Edinburgh Score: Edinburgh Postnatal Depression Scale Screening Tool 03/09/2020  I have been able to laugh and see the funny side of things. 0  I have  looked forward with enjoyment to things. 0  I have blamed myself unnecessarily when things went wrong. 1  I have been anxious or worried for no good reason. 0  I have felt scared or panicky for no good reason. 0  Things have been getting on top of me. 0  I have been so unhappy that I have had difficulty sleeping. 1  I have felt sad or miserable. 0  I have been so unhappy that I have been crying. 0  The thought of harming myself has occurred to me. 0  Edinburgh Postnatal Depression Scale Total 2     After visit meds:  Allergies as of 03/11/2020   No Known Allergies     Medication List    TAKE these medications   ibuprofen 600 MG tablet Commonly known as:  ADVIL Take 1 tablet (600 mg total) by mouth every 8 (eight) hours as needed for mild pain.   NIFEdipine 30 MG 24 hr tablet Commonly known as: ADALAT CC Take 1 tablet (30 mg total) by mouth daily.   PRENATAL VITAMINS PO Take by mouth.        Discharge home in stable condition Infant Feeding: Breast Infant Disposition:home with mother Discharge instruction: per After Visit Summary and Postpartum booklet. Activity: Advance as tolerated. Pelvic rest for 6 weeks.  Diet: routine diet Future Appointments:No future appointments. Follow up Visit:  [facility]. Go in 4 day(s).   Specialty: Obstetrics and Gynecology Contact information: [place], [place] [phone]               Please schedule this patient for a In person postpartum visit in 4 weeks with the following provider: Any provider. Additional Postpartum F/U:BP check 1 week  High risk pregnancy complicated by: HTN Delivery mode:  Vaginal, Spontaneous  Anticipated Birth Control:  Unsure   03/11/2020 Sloan Leiter, MD

## 2020-03-09 NOTE — Progress Notes (Signed)
Patient ID: Rachel Marshall, female   DOB: [birth year], 24 y.o.   MRN: [id number]  Feeling mild cramping; + heartburn  BP 119/61, P 79 FHR 130s, +accels, no decels Ctx irreg 2-6 mins with Pit at 61mu/min Cx deferred (was 5-6/80/vtx -1 at last exam 2315)  IUP@39 .0wks gHTN- mild IOL process  Will continue to titrate Pit to achieve active labor Plan for 4hr Pit break if not in labor by the morning Tums prn  Arabella Merles CNM 03/09/2020 1:50 AM

## 2020-03-09 NOTE — Anesthesia Procedure Notes (Signed)
Epidural Patient location during procedure: OB Start time: 03/09/2020 4:38 AM End time: 03/09/2020 4:44 AM  Staffing Anesthesiologist: Shelton Silvas, MD Performed: anesthesiologist   Preanesthetic Checklist Completed: patient identified, IV checked, site marked, risks and benefits discussed, surgical consent, monitors and equipment checked, pre-op evaluation and timeout performed  Epidural Patient position: sitting Prep: ChloraPrep Patient monitoring: heart rate, continuous pulse ox and blood pressure Approach: midline Location: L3-L4 Injection technique: LOR saline  Needle:  Needle type: Tuohy  Needle gauge: 17 G Needle length: 9 cm Catheter type: closed end flexible Catheter size: 20 Guage Test dose: negative and 1.5% lidocaine  Assessment Events: blood not aspirated, injection not painful, no injection resistance and no paresthesia  Additional Notes LOR @ 5  Patient identified. Risks/Benefits/Options discussed with patient including but not limited to bleeding, infection, nerve damage, paralysis, failed block, incomplete pain control, headache, blood pressure changes, nausea, vomiting, reactions to medications, itching and postpartum back pain. Confirmed with bedside nurse the patient's most recent platelet count. Confirmed with patient that they are not currently taking any anticoagulation, have any bleeding history or any family history of bleeding disorders. Patient expressed understanding and wished to proceed. All questions were answered. Sterile technique was used throughout the entire procedure. Please see nursing notes for vital signs. Test dose was given through epidural catheter and negative prior to continuing to dose epidural or start infusion. Warning signs of high block given to the patient including shortness of breath, tingling/numbness in hands, complete motor block, or any concerning symptoms with instructions to call for help. Patient was given instructions on  fall risk and not to get out of bed. All questions and concerns addressed with instructions to call with any issues or inadequate analgesia.    Reason for block:procedure for pain

## 2020-03-10 NOTE — Lactation Note (Signed)
This note was copied from a baby's chart. Lactation Consultation Note  Patient Name: Rachel Marshall [id number] Date: 03/10/2020   Attempted to visit with mom in her room, but dad told LC that she was in the NICU; she has already started pumping. LC noticed that the junctures on the tubing of her DEBP were loose and adjusted them. Let him know that she'll feel a difference in the suction level the next time she pumps. Dad voiced that mom was getting sore when pumping, LC asked the front desk for coconut oil and instructed to use it prior pumping. LC will attempt to visit with mom again later tonight. Mom's support people are FOB and her mother (GOB).  Maternal Data    Feeding Feeding Type: Formula  LATCH Score                   Interventions    Lactation Tools Discussed/Used     Consult Status      Quindell Shere Venetia Constable 03/10/2020, 6:27 PM

## 2020-03-10 NOTE — Plan of Care (Signed)
Pain well controlled on Ibuprofen.Bleeding in normal limits,DTR's normal. Patient denies H/A,blurred vision or RUQ pain.Discussed what to call the MD for once discharged.

## 2020-03-10 NOTE — Anesthesia Postprocedure Evaluation (Signed)
Anesthesia Post Note  Patient: Rachel Marshall  Procedure(s) Performed: AN AD HOC LABOR EPIDURAL     Patient location during evaluation: Mother Baby Anesthesia Type: Epidural Level of consciousness: awake and alert and oriented Pain management: satisfactory to patient Vital Signs Assessment: post-procedure vital signs reviewed and stable Respiratory status: spontaneous breathing and nonlabored ventilation Cardiovascular status: stable Postop Assessment: no headache, no backache, no signs of nausea or vomiting, adequate PO intake, patient able to bend at knees and able to ambulate (patient up walking) Anesthetic complications: no   No complications documented.  Last Vitals:  Vitals:   03/09/20 2327 03/10/20 0629  BP: 127/74 137/75  Pulse: 90 89  Resp: 15 16  Temp: 36.9 C 36.9 C  SpO2: 100% 100%    Last Pain:  Vitals:   03/10/20 0629  TempSrc: Oral  PainSc: 0-No pain   Pain Goal: Patients Stated Pain Goal: 1 (03/09/20 2135)                 Madison Hickman

## 2020-03-10 NOTE — Lactation Note (Signed)
This note was copied from a baby's chart. Lactation Consultation Note  Patient Name: Rachel Marshall [id number] Date: 03/10/2020 Reason for consult: Initial assessment;Primapara;1st time breastfeeding;NICU baby;Term  Visited with mom of a 62 hours old FT NICU female, baby got admitted due to respiratory distress. Mom is a P1 and reported (+) breast changes during the pregnancy. She's already pumping every 3 hours and getting drops of colostrum, praised her for her efforts. She's familiar with hand expression, she told LC that her RN taught her how to do so. Mom also said that she's been leaking throughout the pregnancy.  Coconut oil has been brought to the room but she hasn't used it yet. She was somehow discouraged with pumping because she reported not getting anything on her last two pumping sessions of the day. Explained to mom that the purpose of pumping this early on is mainly for breast stimulation and not to get volume, she voiced understanding. Reviewed pumping schedule, breastmilk storage guidelines and benefits of breastmilk. Baby has been put on formula while in the NICU.  Feeding plan:  1. Encouraged mom to pump every 3 hours, at least 8 pumping sessions in 24 hours 2. Hand expression and coconut oil use was also encouraged prior pumping.  BF brochure, BF resources and NICU booklet were reviewed. Mom reported all questions and concerns were answered, she's aware of LC OP services and will call PRN.   Maternal Data Formula Feeding for Exclusion: No Has patient been taught Hand Expression?: Yes Does the patient have breastfeeding experience prior to this delivery?: No  Feeding Feeding Type: Formula  LATCH Score                   Interventions Interventions: Breast feeding basics reviewed;DEBP;Coconut oil  Lactation Tools Discussed/Used Tools: Pump;Coconut oil Breast pump type: Double-Electric Breast Pump WIC Program: No Pump Review: Setup, frequency, and  cleaning;Milk Storage Initiated by:: RN Date initiated:: 03/10/20   Consult Status Consult Status: Follow-up Date: 03/11/20 Follow-up type: In-patient    Rachel Marshall 03/10/2020, 9:43 PM

## 2020-03-10 NOTE — Progress Notes (Signed)
Post Partum Day 1 Subjective: no complaints, up ad lib, voiding and tolerating PO  Objective: Blood pressure 137/75, pulse 89, temperature 98.5 F (36.9 C), temperature source Oral, resp. rate 16, height 5\' 9"  (1.753 m), weight 102.1 kg, SpO2 100 %, unknown if currently breastfeeding.  Physical Exam:  General: alert, cooperative and no distress Lochia: appropriate Uterine Fundus: firm DVT Evaluation: No evidence of DVT seen on physical exam.  Recent Labs    03/08/20 1203 03/09/20 0404  HGB 11.0* 11.3*  HCT 33.9* 34.7*    Assessment/Plan: Plan for discharge tomorrow and Breastfeeding   LOS: 2 days   Rachel Marshall 03/10/2020, 7:41 AM

## 2020-03-11 MED ORDER — IBUPROFEN 600 MG PO TABS: 600.0000 mg | ORAL_TABLET | Freq: Three times a day (TID) | ORAL | 1 refills | Status: DC | PRN

## 2020-03-11 MED ORDER — NIFEDIPINE ER 30 MG PO TB24: 30.0000 mg | ORAL_TABLET | Freq: Every day | ORAL | 1 refills | Status: DC

## 2020-03-11 MED ORDER — NIFEDIPINE ER OSMOTIC RELEASE 30 MG PO TB24
30.0000 mg | ORAL_TABLET | Freq: Every day | ORAL | Status: DC
  Administered 2020-03-11: 30 mg via ORAL
  Filled 2020-03-11: qty 1

## 2020-03-11 MED FILL — IBUPROFEN 600 MG TABLET: 600 | 10 days supply | Qty: 30 | Fill #0

## 2020-03-11 MED FILL — NIFEdipine ER 30 MG TB24: 30 | 30 days supply | Qty: 30 | Fill #0

## 2020-03-11 NOTE — Lactation Note (Signed)
This note was copied from a baby's chart. Lactation Consultation Note  Patient Name: Rachel Marshall [id number] Date: 03/11/2020 Reason for consult: Follow-up assessment;Primapara;1st time breastfeeding;NICU baby  LC in to visit with P1 Mom of term baby at 46 hrs old.  Baby in the NICU for respiratory support on ventilator.  Mom has been consistently pumping and now obtaining small amounts of colostrum.  Mom has colostrum containers and labels.   Mom is being discharged today.  She plans to run home to shower, and will return to stay in baby's room.  Mom aware of [employer] DEBP in baby's room for her use.  Mom doesn't have a pump at home, but will be speaking to her insurance company.  Mom knows how to assemble the parts to create a hand pump for her home use today.   Pump rental available in gift shop, Mom aware of cost.  Encouraged pumping every 2-3 hrs during the day and 3-4 hrs at night.  Engorgement prevention and treatment reviewed.  Mom aware of lactation support available to her, encouraged her to request LC prn.   Interventions Interventions: Breast feeding basics reviewed;Breast massage;Hand express;DEBP;Hand pump  Lactation Tools Discussed/Used Tools: Pump Breast pump type: Double-Electric Breast Pump   Consult Status Consult Status: Follow-up Date: 03/14/20 Follow-up type: In-patient    Judee Clara 03/11/2020, 12:41 PM

## 2020-03-11 NOTE — Plan of Care (Signed)
Home care discussed again and what to call the MD for. Denies H/A, blurred vision,RUQ pain, and DTR' are WNL.

## 2020-03-11 NOTE — Progress Notes (Signed)
Discharge teaching complete with pt. Pre-e signs and symptoms discussed. Meds to beds to deliver medications.

## 2020-03-11 NOTE — Discharge Instructions (Signed)

## 2020-03-12 NOTE — Progress Notes (Addendum)
Patient screened out for psychosocial assessment since none of the following apply: °Psychosocial stressors documented in mother or baby's chart °Gestation less than 32 weeks °Code at delivery  °Infant with anomalies °Please contact the Clinical Social Worker if specific needs arise, by [name]'s request, or if [name] scores greater than 9/yes to question 10 on Edinburgh Postpartum Depression Screen. ° °Tashi Andujo Boyd-Gilyard, MSW, LCSW °Clinical Social Work °[phone] °  °

## 2020-03-14 ENCOUNTER — Other Ambulatory Visit: Payer: Self-pay

## 2020-03-14 ENCOUNTER — Ambulatory Visit (INDEPENDENT_AMBULATORY_CARE_PROVIDER_SITE_OTHER): Payer: 59

## 2020-03-14 VITALS — BP 132/88 | HR 67

## 2020-03-14 DIAGNOSIS — Z0130 Encounter for examination of blood pressure without abnormal findings: Secondary | ICD-10-CM

## 2020-03-14 NOTE — Progress Notes (Signed)
Pt here for blood pressure check.Pt currently taking 30mg  of Adalat daily. BP 132/88. Pt denies any headache or changes in vision. Pt states she still has some swelling in her legs. I recommended pt to call office or go to [facility]U if BP increases, swelling increases or she develops a headache or changes in vision. Pt expressed understanding.

## 2020-03-15 ENCOUNTER — Encounter: Payer: 59 | Admitting: Certified Nurse Midwife

## 2020-03-20 ENCOUNTER — Ambulatory Visit: Payer: Self-pay

## 2020-03-20 NOTE — Lactation Note (Signed)
This note was copied from a baby's chart. Lactation Consultation Note  Patient Name: Rachel Marshall [id number] Date: 03/20/2020 Reason for consult: Follow-up assessment;Primapara;1st time breastfeeding;NICU baby;Term Pneumothorax   LC in to visit with P1 Mom of term baby at 65 days old.  Baby is gaining 30-60 gm per day on EBM via NG feeding.  Baby is being weaned from HFNC down to 1L and 21%.  Baby is being weaned from sedation medication used while on ventilator.   Assisted Mom with having baby STS on her chest in reclined position.  Tucked baby under her elastic top.  Baby quiet alert, holding his head up to look at his Mom and FOB.  Mom needing assistance to relax her body, as baby holding his hand tightly near his face.  Once Mom relaxed her torso, shoulders dropped, baby relaxed his hands.  Discussed with Mom watching for subtle cues.  While baby on sedation, these cues are somewhat minimal.  FOB encouraged to wash his hands and let baby taste EBM from his finger, or to place a drop on his lips.  Baby sucked a couple times on FOB's finger.  FOB asked for pacifier, baby wouldn't open for pacifer with EBM on it.    Encouraged Mom to have baby STS as much as she can now.  Baby will be transitioning to a crib, which will make it easier for parents to move baby.    Mom is pumping every 2-3 hrs during the day and 3-4 hrs at night, last pumping she expressed 120 ml.   Praised Mom for her commitment to providing milk for her baby.  Mom knows that latching baby may take some time, but having baby STS is step one.   Interventions Interventions: Breast feeding basics reviewed;Skin to skin;DEBP  Lactation Tools Discussed/Used Tools: Pump Breast pump type: Double-Electric Breast Pump   Consult Status Consult Status: Follow-up Date: 03/21/20 Follow-up type: In-patient    Rachel Marshall 03/20/2020, 2:23 PM

## 2020-03-22 ENCOUNTER — Ambulatory Visit: Payer: Self-pay

## 2020-03-22 NOTE — Lactation Note (Signed)
This note was copied from a baby's chart. Lactation Consultation Note  Patient Name: Boy Keayra Graham JQZES'P Date: 03/22/2020 Reason for consult: Follow-up assessment;Mother's request;Primapara;1st time breastfeeding;NICU baby;Early term 37-38.6wks  1207 - 1210 - I followed up with Ms. Gagan upon request. She was holding [name] while he was being gavage fed. She states that he latched briefly several minutes prior to entry, and he was off and on the breast for about 10 minutes. At this time, he was sleeping and not cueing. I offered to return at the next feeding, and her RN suggested that 1400 would be a good time.  1410 - 1430 - I returned to the room to assist with feeding. Baby was still sleepy. We practiced postioning in cross cradle hold at the breast and hand expression. We rubbed milk into his gums, but he never cued. I educated on developmental readiness for breast feeding and encouraged Ms. Vermette to continue to do some STS with opportunities to latch around feedings times. I volunteered to return tomorrow to follow up and she agreed.  We reviewed her pumping regimen. She is pumping 8 times a day and is getting approximately 120 mls in the am with some reduction in volume later in the day. She has no concerns or questions about pumping at this time.  Ms. Boerema is rooming in at this time.   Lactation to follow up on 7/3.  Maternal Data Has patient been taught Hand Expression?: Yes Does the patient have breastfeeding experience prior to this delivery?: No  Feeding Feeding Type: Breast Milk  LATCH Score Latch: Too sleepy or reluctant, no latch achieved, no sucking elicited.  Audible Swallowing: None  Type of Nipple: Everted at rest and after stimulation  Comfort (Breast/Nipple): Soft / non-tender  Hold (Positioning): Assistance needed to correctly position infant at breast and maintain latch.  LATCH Score: 5  Interventions Interventions: Breast feeding basics  reviewed;Skin to skin;Hand express;Adjust position;Support pillows  Lactation Tools Discussed/Used Tools: 58F feeding tube / Syringe   Consult Status Consult Status: Follow-up Date: 03/23/20 Follow-up type: In-patient    Walker Shadow 03/22/2020, 2:33 PM

## 2020-03-23 ENCOUNTER — Ambulatory Visit: Payer: Self-pay

## 2020-03-23 NOTE — Lactation Note (Signed)
This note was copied from a baby's chart. Lactation Consultation Note  Patient Name: Rachel Marshall KZSWF'U Date: 03/23/2020 Reason for consult: Follow-up assessment;Mother's request;Difficult latch;Primapara;1st time breastfeeding;NICU baby;Term  1053 - 1118 - I followed up with Ms. Greis upon request. She wanted assistance with helping [name] to latch; this was his feeding time.   We undressed [name], and we placed him on Ms. Lorenzi's right breast in football hold. He showed feeding cues (rooting, opening mouth, etc.) and attempted to latch. He was unable to grasp breast and maintain seal, but he showed plenty of interest. RN and Ms. Trickey confirmed that baby takes a pacifier well. I allowed him to suckle on my gloved finger, and he was rhythmic.  I then placed a size 24 NS on her breast, and we worked with him to latch. It took him several minutes to find his latch. He held the shield in his mouth or released the nipple and started over. After some time, he relaxed and became very rhythmic at the breast with good suckling sequences and audible spontaneous swallows noted. He actively fed for about 6 minutes and then stopped and fell asleep (being gavage fed at this time). I noted milk in the nipple shield upon release.  Ms. Kapusta expressed happiness to see him latch. I showed her again how to place the nipple shield and encouraged her to use it for now to assist with latching.   I recommended that she continue to breast feed today or offer time at the breast for baby to continue learning. She verbalized understanding. I also recommended that she continue to post-pump at least 8 times a day.  Maternal Data Formula Feeding for Exclusion: No Has patient been taught Hand Expression?: Yes Does the patient have breastfeeding experience prior to this delivery?: No  Feeding Feeding Type: Breast Milk  LATCH Score Latch: Grasps breast easily, tongue down, lips flanged, rhythmical  sucking.  Audible Swallowing: A few with stimulation  Type of Nipple: Everted at rest and after stimulation  Comfort (Breast/Nipple): Soft / non-tender  Hold (Positioning): Assistance needed to correctly position infant at breast and maintain latch.  LATCH Score: 8  Interventions Interventions: Breast feeding basics reviewed;Assisted with latch  Lactation Tools Discussed/Used Tools: Nipple Shields Nipple shield size: 24 Breast pump type: Double-Electric Breast Pump WIC Program: No   Consult Status Consult Status: Follow-up Date: 03/25/20 Follow-up type: In-patient    Walker Shadow 03/23/2020, 11:25 AM

## 2020-03-27 ENCOUNTER — Ambulatory Visit: Payer: Self-pay

## 2020-03-27 NOTE — Lactation Note (Signed)
This note was copied from a baby's chart. Lactation Consultation Note  Patient Name: Boy Kahlee Metivier SEGBT'D Date: 03/27/2020 Reason for consult: Follow-up assessment;Primapara;1st time breastfeeding;NICU baby;Term  1140 - 1150 - I followed up with Ms. Brosseau to check on her progress with feeding. She states that at this stage she is primarily pumping due to some challenges with maintaining her pumping schedule with breast feeding. She plans to return to work at the end of this month, and she states that pumping is okay with her.   She states that she is getting enough milk to freeze with pumping with the milk lab. I invited her to call lactation if she has any questions about milk supply or if she wants to practice latching. I provided lots of encouragement that her feeding plan could be adapted and she can always go back to latching baby later if she wants to. She verbalized understanding.   She plans to follow up with [facility] with baby's discharge. We discussed baby's progress over the last week, and Ms. Brauer is encouraged by that.  I told Ms. College we would place her as follow up PRN since she is comfortable with her pumping plan; I also mentioned that lactation would like to see her upon discharge as well. She verbalized understanding.   Maternal Data Does the patient have breastfeeding experience prior to this delivery?: No  Feeding Feeding Type: Breast Milk Nipple Type: Nfant Slow Flow (purple)  Interventions Interventions: Breast feeding basics reviewed  Lactation Tools Discussed/Used     Consult Status Consult Status: PRN Date: 04/03/20 Follow-up type: Call as needed    Walker Shadow 03/27/2020, 11:55 AM

## 2020-04-12 ENCOUNTER — Encounter: Payer: Self-pay | Admitting: Certified Nurse Midwife

## 2020-04-12 ENCOUNTER — Other Ambulatory Visit: Payer: Self-pay

## 2020-04-12 ENCOUNTER — Other Ambulatory Visit (HOSPITAL_COMMUNITY)
Admission: RE | Admit: 2020-04-12 | Discharge: 2020-04-12 | Disposition: A | Discharge status: Home / Self Care | Payer: 59 | Source: Ambulatory Visit | Attending: Certified Nurse Midwife | Admitting: Certified Nurse Midwife

## 2020-04-12 ENCOUNTER — Ambulatory Visit (INDEPENDENT_AMBULATORY_CARE_PROVIDER_SITE_OTHER): Payer: 59 | Admitting: Certified Nurse Midwife

## 2020-04-12 VITALS — BP 111/67 | HR 62 | Ht 69.0 in | Wt 186.0 lb

## 2020-04-12 DIAGNOSIS — Z3009 Encounter for other general counseling and advice on contraception: Secondary | ICD-10-CM

## 2020-04-12 DIAGNOSIS — Z124 Encounter for screening for malignant neoplasm of cervix: Secondary | ICD-10-CM

## 2020-04-12 NOTE — Progress Notes (Signed)
    Post Partum Visit Note  Rachel Marshall is a 24 y.o. G4P1001 female who presents for a postpartum visit. She is 5 weeks postpartum following a normal spontaneous vaginal delivery.  I have fully reviewed the prenatal and intrapartum course. The delivery was at 39 gestational weeks, her pregnancy was complicated by gHTN.  Anesthesia: epidural. Postpartum course has been unremarkable. Baby is doing well. Baby is feeding by breast. Bleeding no bleeding. Bowel function is normal. Bladder function is normal. Patient is not sexually active. Contraception method is oral progesterone-only contraceptive. Postpartum depression screening: negative.   The following portions of the patient's history were reviewed and updated as appropriate: allergies, current medications, past family history, past medical history, past social history, past surgical history and problem list.  Review of Systems Pertinent items noted in HPI and remainder of comprehensive ROS otherwise negative.    Objective:  Blood pressure 111/67, pulse 62, height 5\' 9"  (1.753 m), weight 186 lb (84.4 kg), currently breastfeeding.  General:  alert, cooperative and no distress   Breasts:  Not examined  Lungs: Reg rate and effort  Heart:  Reg rate  Abdomen: Not examined   Vulva:  normal  Vagina: normal vagina, laceration well healed  Cervix:  anteverted and no cervical motion tenderness; pap collected  Corpus: not examined  Adnexa:  not evaluated  Rectal Exam: Not performed.        Assessment:   Normal postpartum exam Pap smear done at today's visit Gestational HTN, resolved   Plan:   Essential components of care per ACOG recommendations:  1.  Mood and well being: Patient with negative depression screening today. Reviewed local resources for support.  - Patient does not use tobacco. If using tobacco we discussed reduction and for recently cessation risk of relapse - hx of drug use? No  If yes, discussed support systems  2.  Infant care and feeding:  -Patient currently breastmilk feeding? Yes  If breastmilk feeding discussed return to work and pumping. If needed, patient was provided letter for work to allow for every 2-3 hr pumping breaks, and to be granted a private location to express breastmilk and refrigerated area to store breastmilk. Reviewed importance of draining breast regularly to support lactation. -Social determinants of health (SDOH) reviewed in EPIC. No concerns  3. Sexuality, contraception and birth spacing - Patient does not want a pregnancy in the next year.    - Reviewed forms of contraception in tiered fashion. Patient desired oral progesterone-only contraceptive today.   - Discussed birth spacing of 18 months  4. Sleep and fatigue -Encouraged family/partner/community support of 4 hrs of uninterrupted sleep to help with mood and fatigue  5. Physical Recovery  - Discussed patients delivery and complications - Patient had a left labial laceration, healing reviewed. Patient expressed understanding - Patient has urinary incontinence? No - Patient is safe to resume physical and sexual activity  6.  Health Maintenance - Last pap smear done today  7. No Chronic Disease - PCP follow up prn  , CNM [facility] for Donette Larry, [facility]

## 2020-04-15 MED ORDER — NORETHINDRONE 0.35 MG PO TABS: 1.0000 | ORAL_TABLET | Freq: Every day | ORAL | 11 refills | Status: DC

## 2020-04-15 NOTE — Addendum Note (Signed)
Addended by: Donette Larry E on: 04/15/2020 09:23 PM   Modules accepted: Orders

## 2020-04-17 LAB — CYTOLOGY - PAP
Comment: NEGATIVE
Diagnosis: UNDETERMINED — AB
High risk HPV: NEGATIVE

## 2023-06-14 ENCOUNTER — Telehealth: Payer: Self-pay | Admitting: *Deleted

## 2023-06-14 NOTE — Telephone Encounter (Signed)
Returned call from 3:43 PM. Left patient a message to call and schedule New OB appointment.

## 2023-07-12 ENCOUNTER — Telehealth: Payer: Self-pay

## 2023-07-12 NOTE — Telephone Encounter (Signed)
Returning patient phone call. Spoke with patient, patient c/o light pink discharge when she wipes after using the bathroom. Patient states she had sexual intercourse this morning. Advised patient this is normal. Patient voices understanding.

## 2023-07-19 ENCOUNTER — Encounter: Payer: Self-pay | Admitting: *Deleted

## 2023-07-19 DIAGNOSIS — Z3490 Encounter for supervision of normal pregnancy, unspecified, unspecified trimester: Secondary | ICD-10-CM | POA: Insufficient documentation

## 2023-07-19 NOTE — Progress Notes (Signed)
Needs pap

## 2023-07-20 ENCOUNTER — Encounter: Payer: Self-pay | Admitting: Obstetrics and Gynecology

## 2023-07-29 ENCOUNTER — Telehealth: Payer: Self-pay

## 2023-07-29 NOTE — Telephone Encounter (Signed)
Attempted to call pt to verify insurance is in network and set up u/s for her NOB appt on 07/29/23. Pt did not answer. VM left asking pt to call office.

## 2023-08-02 ENCOUNTER — Ambulatory Visit: Payer: BC Managed Care – PPO

## 2023-08-02 ENCOUNTER — Other Ambulatory Visit: Payer: Self-pay

## 2023-08-02 ENCOUNTER — Encounter: Payer: Self-pay | Admitting: Obstetrics & Gynecology

## 2023-08-02 DIAGNOSIS — O3680X0 Pregnancy with inconclusive fetal viability, not applicable or unspecified: Secondary | ICD-10-CM | POA: Diagnosis not present

## 2023-08-02 DIAGNOSIS — Z3A13 13 weeks gestation of pregnancy: Secondary | ICD-10-CM

## 2023-08-02 NOTE — Progress Notes (Signed)
U/S order placed for imaging dept per Dr.Leggett

## 2023-08-09 ENCOUNTER — Encounter: Payer: Self-pay | Admitting: Obstetrics and Gynecology

## 2023-08-09 ENCOUNTER — Ambulatory Visit: Payer: BC Managed Care – PPO | Admitting: Obstetrics and Gynecology

## 2023-08-09 ENCOUNTER — Other Ambulatory Visit (HOSPITAL_COMMUNITY)
Admission: RE | Admit: 2023-08-09 | Discharge: 2023-08-09 | Disposition: A | Discharge status: Home / Self Care | Payer: BC Managed Care – PPO | Source: Ambulatory Visit | Attending: Obstetrics and Gynecology | Admitting: Obstetrics and Gynecology

## 2023-08-09 VITALS — BP 134/82 | HR 99 | Wt 195.0 lb

## 2023-08-09 DIAGNOSIS — Z1339 Encounter for screening examination for other mental health and behavioral disorders: Secondary | ICD-10-CM | POA: Diagnosis not present

## 2023-08-09 DIAGNOSIS — Z3689 Encounter for other specified antenatal screening: Secondary | ICD-10-CM | POA: Insufficient documentation

## 2023-08-09 DIAGNOSIS — Z3481 Encounter for supervision of other normal pregnancy, first trimester: Secondary | ICD-10-CM

## 2023-08-09 DIAGNOSIS — Z3A13 13 weeks gestation of pregnancy: Secondary | ICD-10-CM | POA: Diagnosis not present

## 2023-08-09 DIAGNOSIS — Z8759 Personal history of other complications of pregnancy, childbirth and the puerperium: Secondary | ICD-10-CM | POA: Diagnosis not present

## 2023-08-09 MED ORDER — ASPIRIN 81 MG PO CHEW: 81.0000 mg | CHEWABLE_TABLET | Freq: Every day | ORAL | 3 refills | Status: DC

## 2023-08-09 NOTE — Progress Notes (Signed)
INITIAL PRENATAL VISIT NOTE  Subjective:  Rachel Marshall is a 27 y.o. G2P1001 at [redacted]w[redacted]d by early ultrasound being seen today for her initial prenatal visit. She has an obstetric history significant for IOL 2/2 gestational HTN. She has an uncomplicated medical history .  Patient reports no complaints.  Contractions: Not present. Vag. Bleeding: None.  Movement: Absent. Denies leaking of fluid.    Past Medical History:  Diagnosis Date   History of syncope    Orthostatic hypotension 2020   Seizures (HCC)    one episode in 2012    Past Surgical History:  Procedure Laterality Date   WISDOM TOOTH EXTRACTION      OB History  Gravida Para Term Preterm AB Living  2 1 1     1   SAB IAB Ectopic Multiple Live Births        0 1    # Outcome Date GA Lbr Len/2nd Weight Sex Type Anes PTL Lv  2 Current           1 Term 03/09/20 [redacted]w[redacted]d 10:36 / 00:34 8 lb 12.7 oz (3.99 kg) M Vag-Spont EPI  LIV    Social History   Socioeconomic History   Marital status: Married    Spouse name: Not on file   Number of children: Not on file   Years of education: Not on file   Highest education level: Not on file  Occupational History   Not on file  Tobacco Use   Smoking status: Never   Smokeless tobacco: Never  Vaping Use   Vaping status: Never Used  Substance and Sexual Activity   Alcohol use: Not Currently   Drug use: Never   Sexual activity: Yes    Partners: Male    Birth control/protection: None  Other Topics Concern   Not on file  Social History Narrative   Not on file   Social Determinants of Health   Financial Resource Strain: Not on file  Food Insecurity: Not on file  Transportation Needs: Not on file  Physical Activity: Not on file  Stress: Not on file  Social Connections: Unknown (06/20/2022)   [facility]   Social Network    Social Network: Not on file    Family History  Problem Relation Age of Onset   Breast cancer Maternal Grandmother    Hypertension  Mother      Current Outpatient Medications:    aspirin 81 MG chewable tablet, Chew 1 tablet (81 mg total) by mouth daily. Start at 16 weeks, Disp: 90 tablet, Rfl: 3   Prenatal Vit-Fe Fumarate-FA (PRENATAL VITAMINS PO), Take by mouth. , Disp: , Rfl:   No Known Allergies  Review of Systems: Negative except for what is mentioned in HPI.  Objective:   Vitals:   08/09/23 0952  BP: 134/82  Pulse: 99  Weight: 195 lb (88.5 kg)    Fetal Status: Fetal Heart Rate (bpm): 153   Movement: Absent     Physical Exam: BP 134/82   Pulse 99   Wt 195 lb (88.5 kg)   LMP 05/10/2023   BMI 28.80 kg/m  CONSTITUTIONAL: Well-developed, well-nourished female in no acute distress.  NEUROLOGIC: Alert and oriented to person, place, and time. Normal reflexes, muscle tone coordination. No cranial nerve deficit noted. PSYCHIATRIC: Normal mood and affect. Normal behavior. Normal judgment and thought content. SKIN: Skin is warm and dry. No rash noted. Not diaphoretic. No erythema. No pallor. HENT:  Normocephalic, atraumatic, External right and left ear normal.  Oropharynx is clear and moist EYES: Conjunctivae and EOM are normal.  NECK: Normal range of motion, supple, no masses CARDIOVASCULAR: Normal heart rate noted, regular rhythm RESPIRATORY: Effort and breath sounds normal, no problems with respiration noted BREASTS: deferred ABDOMEN: Soft, nontender, nondistended, gravid. GU: normal appearing external female genitalia, multiparous, normal appearing cervix, scant white discharge in vagina, no lesions noted, pap taken with chaperone present Bimanual: 14 weeks sized uterus, no adnexal tenderness or palpable lesions noted MUSCULOSKELETAL: Normal range of motion. EXT:  No edema and no tenderness. 2+ distal pulses.   Assessment and Plan:  Pregnancy: G2P1001 at [redacted]w[redacted]d by ultrasound  1. [redacted] weeks gestation of pregnancy   2. Encounter for supervision of other normal pregnancy in first trimester Continue  routine prenatal care  - Cytology - PAP - Pregnancy, Initial Screen - PANORAMA PRENATAL TEST - HORIZON Basic Panel - [place] MFM OB COMP + 14 WK; Future  3. History of gestational hypertension BP not clinically elevated but borderline, will watch, pt advised to start baby ASA at 16 weeks  - aspirin 81 MG chewable tablet; Chew 1 tablet (81 mg total) by mouth daily. Start at 16 weeks  Dispense: 90 tablet; Refill: 3   Preterm labor symptoms and general obstetric precautions including but not limited to vaginal bleeding, contractions, leaking of fluid and fetal movement were reviewed in detail with the patient.  Please refer to After Visit Summary for other counseling recommendations.   Return in about 4 weeks (around 09/06/2023) for ROB, in person.  Warden Fillers 08/09/2023 10:21 AM

## 2023-08-11 LAB — PREGNANCY, INITIAL SCREEN
Antibody Screen: NEGATIVE
Basophils Absolute: 0 10*3/uL (ref 0.0–0.2)
Basos: 0 %
Bilirubin, UA: NEGATIVE
Chlamydia trachomatis, NAA: NEGATIVE
EOS (ABSOLUTE): 0.1 10*3/uL (ref 0.0–0.4)
Eos: 1 %
Glucose, UA: NEGATIVE
HCV Ab: NONREACTIVE
HIV Screen 4th Generation wRfx: NONREACTIVE
Hematocrit: 38.9 % (ref 34.0–46.6)
Hemoglobin: 12.8 g/dL (ref 11.1–15.9)
Hepatitis B Surface Ag: NEGATIVE
Immature Grans (Abs): 0 10*3/uL (ref 0.0–0.1)
Immature Granulocytes: 0 %
Ketones, UA: NEGATIVE
Leukocytes,UA: NEGATIVE
Lymphocytes Absolute: 2.1 10*3/uL (ref 0.7–3.1)
Lymphs: 27 %
MCH: 30 pg (ref 26.6–33.0)
MCHC: 32.9 g/dL (ref 31.5–35.7)
MCV: 91 fL (ref 79–97)
Monocytes Absolute: 0.4 10*3/uL (ref 0.1–0.9)
Monocytes: 5 %
Neisseria Gonorrhoeae by PCR: NEGATIVE
Neutrophils Absolute: 5.2 10*3/uL (ref 1.4–7.0)
Neutrophils: 67 %
Nitrite, UA: NEGATIVE
Platelets: 222 10*3/uL (ref 150–450)
Protein,UA: NEGATIVE
RBC, UA: NEGATIVE
RBC: 4.27 x10E6/uL (ref 3.77–5.28)
RDW: 12.8 % (ref 11.7–15.4)
RPR Ser Ql: NONREACTIVE
Rh Factor: POSITIVE
Rubella Antibodies, IGG: 5.43 {index} (ref >0.99)
Specific Gravity, UA: 1.007 (ref 1.005–1.030)
Urobilinogen, Ur: 0.2 mg/dL (ref 0.2–1.0)
WBC: 7.8 10*3/uL (ref 3.4–10.8)
pH, UA: 8 — ABNORMAL HIGH (ref 5.0–7.5)

## 2023-08-11 LAB — CYTOLOGY - PAP: Diagnosis: NEGATIVE

## 2023-08-11 LAB — URINE CULTURE, OB REFLEX

## 2023-08-11 LAB — MICROSCOPIC EXAMINATION
Bacteria, UA: NONE SEEN
Casts: NONE SEEN /[LPF]
RBC, Urine: NONE SEEN /[HPF] (ref 0–2)
WBC, UA: NONE SEEN /[HPF] (ref 0–5)

## 2023-08-11 LAB — HCV INTERPRETATION

## 2023-08-14 LAB — PANORAMA PRENATAL TEST FULL PANEL:PANORAMA TEST PLUS 5 ADDITIONAL MICRODELETIONS: FETAL FRACTION: 13.9

## 2023-08-16 LAB — HORIZON CUSTOM: REPORT SUMMARY: NEGATIVE

## 2023-09-13 ENCOUNTER — Ambulatory Visit (INDEPENDENT_AMBULATORY_CARE_PROVIDER_SITE_OTHER): Payer: BC Managed Care – PPO | Admitting: Obstetrics and Gynecology

## 2023-09-13 VITALS — BP 125/75 | HR 104 | Wt 200.0 lb

## 2023-09-13 DIAGNOSIS — Z8759 Personal history of other complications of pregnancy, childbirth and the puerperium: Secondary | ICD-10-CM

## 2023-09-13 DIAGNOSIS — Z3A18 18 weeks gestation of pregnancy: Secondary | ICD-10-CM

## 2023-09-13 DIAGNOSIS — Z3482 Encounter for supervision of other normal pregnancy, second trimester: Secondary | ICD-10-CM

## 2023-09-13 NOTE — Progress Notes (Signed)
   PRENATAL VISIT NOTE  Subjective:  Rachel Marshall is a 27 y.o. G2P1001 at [redacted]w[redacted]d being seen today for ongoing prenatal care.  She is currently monitored for the following issues for this low-risk pregnancy and has Supervision of normal pregnancy and History of gestational hypertension on their problem list.  Patient reports no complaints.  Contractions: Not present. Vag. Bleeding: None.  Movement: Present. Denies leaking of fluid.   The following portions of the patient's history were reviewed and updated as appropriate: allergies, current medications, past family history, past medical history, past social history, past surgical history and problem list.   Objective:   Vitals:   09/13/23 1350  BP: 125/75  Pulse: (!) 104  Weight: 200 lb (90.7 kg)    Fetal Status: Fetal Heart Rate (bpm): 158   Movement: Present     General:  Alert, oriented and cooperative. Patient is in no acute distress.  Skin: Skin is warm and dry. No rash noted.   Cardiovascular: Normal heart rate noted  Respiratory: Normal respiratory effort, no problems with respiration noted  Abdomen: Soft, gravid, appropriate for gestational age.  Pain/Pressure: Absent     Pelvic: Cervical exam deferred        Extremities: Normal range of motion.  Edema: None  Mental Status: Normal mood and affect. Normal behavior. Normal judgment and thought content.   Assessment and Plan:  Pregnancy: G2P1001 at [redacted]w[redacted]d 1. Encounter for supervision of other normal pregnancy in second trimester (Primary) MOC: Considering tubal. Wants to be done with child bearing.  MOF: Breast Flu shot - Declines Has anatomy US scheduled.  MSAFP today.   2. History of gestational hypertension Had not started ldASA - worried about low BP. Reviewed mechanism of action of aspirin and does not impact BP. Reviewed risk reduction for gHTN/preE and associated sequelae. Reviewed still benefit to start at this time. She will start.   3. Pregnancy with 18  completed weeks gestation   Preterm labor symptoms and general obstetric precautions including but not limited to vaginal bleeding, contractions, leaking of fluid and fetal movement were reviewed in detail with the patient. Please refer to After Visit Summary for other counseling recommendations.   No follow-ups on file.  Future Appointments  Date Time Provider Department Center  09/20/2023  7:15 AM [facility] NURSE [facility]  09/20/2023  7:30 AM [facility] US4 [facility]    Milas Hock, MD

## 2023-09-17 ENCOUNTER — Telehealth: Payer: Self-pay | Admitting: *Deleted

## 2023-09-17 LAB — AFP, SERUM, OPEN SPINA BIFIDA
AFP MoM: 0.93
AFP Value: 36.6 ng/mL
Gest. Age on Collection Date: 18.7 wk
Maternal Age At EDD: 26.9 a
OSBR Risk 1 IN: 10000
Test Results:: NEGATIVE
Weight: 200 [lb_av]

## 2023-09-17 NOTE — Telephone Encounter (Signed)
Left patient a message with scheduled appointment information and to call or MyChart message if she cannot keep the appointment as scheduled.

## 2023-09-20 ENCOUNTER — Ambulatory Visit: Payer: BC Managed Care – PPO | Admitting: *Deleted

## 2023-09-20 ENCOUNTER — Ambulatory Visit: Payer: BC Managed Care – PPO | Attending: Obstetrics and Gynecology

## 2023-09-20 ENCOUNTER — Other Ambulatory Visit: Payer: Self-pay | Admitting: *Deleted

## 2023-09-20 ENCOUNTER — Encounter: Payer: Self-pay | Admitting: *Deleted

## 2023-09-20 ENCOUNTER — Other Ambulatory Visit: Payer: Self-pay

## 2023-09-20 VITALS — BP 121/65 | HR 73

## 2023-09-20 DIAGNOSIS — Z3482 Encounter for supervision of other normal pregnancy, second trimester: Secondary | ICD-10-CM | POA: Diagnosis not present

## 2023-09-20 DIAGNOSIS — Z3689 Encounter for other specified antenatal screening: Secondary | ICD-10-CM | POA: Diagnosis present

## 2023-09-20 DIAGNOSIS — Z3A19 19 weeks gestation of pregnancy: Secondary | ICD-10-CM | POA: Diagnosis not present

## 2023-09-20 DIAGNOSIS — Z363 Encounter for antenatal screening for malformations: Secondary | ICD-10-CM | POA: Diagnosis not present

## 2023-09-20 DIAGNOSIS — Z362 Encounter for other antenatal screening follow-up: Secondary | ICD-10-CM

## 2023-09-20 DIAGNOSIS — Z3481 Encounter for supervision of other normal pregnancy, first trimester: Secondary | ICD-10-CM

## 2023-09-22 NOTE — L&D Delivery Note (Signed)
 Delivery Note ROM x 1h 5m. At 12:39 PM a viable and healthy female was delivered via Vaginal, Spontaneous (Presentation: Middle Occiput Anterior). Shoulders delivered easily. Infant placed skin-to-skin w/ mom. Delayed cord clamping x 1 minute. Cord clamped x 2 and cut by FOB. APGAR: 9, 9; weight  pending.  Pitocin  bolus started after delivery of baby. Placenta status: Spontaneous, Intact.  Cord: 3 vessels with the following complications: none.  Cord pH: NA  Anesthesia: Epidural Episiotomy: None Lacerations: Superficial, no repair Suture Repair:  .NA Est. Blood Loss (mL): 250  Mom to postpartum.  Baby to Couplet care / Skin to Skin. Placenta to: LD Feeding: Pumped breast milk Circ: NA Contraception: BTL. Will keep NPO now in case it can be done this afternoon.    Rachel Marshall  Rachel Marshall 01/26/2024, 1:27 PM

## 2023-10-11 ENCOUNTER — Encounter: Payer: BC Managed Care – PPO | Admitting: Obstetrics & Gynecology

## 2023-10-18 ENCOUNTER — Ambulatory Visit: Payer: BC Managed Care – PPO | Attending: Obstetrics

## 2023-10-18 ENCOUNTER — Other Ambulatory Visit: Payer: Self-pay | Admitting: *Deleted

## 2023-10-18 ENCOUNTER — Other Ambulatory Visit: Payer: Self-pay

## 2023-10-18 DIAGNOSIS — Z362 Encounter for other antenatal screening follow-up: Secondary | ICD-10-CM | POA: Insufficient documentation

## 2023-10-18 DIAGNOSIS — Z3A23 23 weeks gestation of pregnancy: Secondary | ICD-10-CM

## 2023-10-18 DIAGNOSIS — O358XX0 Maternal care for other (suspected) fetal abnormality and damage, not applicable or unspecified: Secondary | ICD-10-CM

## 2023-10-18 DIAGNOSIS — Z8679 Personal history of other diseases of the circulatory system: Secondary | ICD-10-CM

## 2023-10-23 ENCOUNTER — Encounter: Payer: Self-pay | Admitting: Obstetrics and Gynecology

## 2023-11-06 NOTE — Progress Notes (Unsigned)
   PRENATAL VISIT NOTE  Subjective:  Rachel Marshall is a 28 y.o. G2P1001 at [redacted]w[redacted]d being seen today for ongoing prenatal care.  She is currently monitored for the following issues for this low-risk pregnancy and has Supervision of normal pregnancy and History of gestational hypertension on their problem list.  Patient reports {[id number]}.   .  .   . Denies leaking of fluid.   The following portions of the patient's history were reviewed and updated as appropriate: allergies, current medications, past family history, past medical history, past social history, past surgical history and problem list.   Objective:  There were no vitals filed for this visit.  Fetal Status:           General:  Alert, oriented and cooperative. Patient is in no acute distress.  Skin: Skin is warm and dry. No rash noted.   Cardiovascular: Normal heart rate noted  Respiratory: Normal respiratory effort, no problems with respiration noted  Abdomen: Soft, gravid, appropriate for gestational age.        Pelvic: Cervical exam deferred        Extremities: Normal range of motion.     Mental Status: Normal mood and affect. Normal behavior. Normal judgment and thought content.   Assessment and Plan:  Pregnancy: G2P1001 at [redacted]w[redacted]d 1. Encounter for supervision of other normal pregnancy in second trimester (Primary) GDM labs today.  Offer TDAP next visit.   2. History of gestational hypertension BP today is *** Check baseline CMP and urine p/c ratio.  Has normal growth - MFM to repeat next [place] on 3/31 for this history.   3. Pregnancy with 26 completed weeks gestation   Preterm labor symptoms and general obstetric precautions including but not limited to vaginal bleeding, contractions, leaking of fluid and fetal movement were reviewed in detail with the patient. Please refer to After Visit Summary for other counseling recommendations.   No follow-ups on file.  Future Appointments  Date Time Provider Department  Center  11/08/2023  8:50 AM Milas Hock, MD [facility]  12/20/2023  9:30 AM [facility] US3 [facility]    Milas Hock, MD

## 2023-11-08 ENCOUNTER — Ambulatory Visit (INDEPENDENT_AMBULATORY_CARE_PROVIDER_SITE_OTHER): Payer: BC Managed Care – PPO | Admitting: Obstetrics and Gynecology

## 2023-11-08 VITALS — BP 122/79 | HR 82 | Wt 215.0 lb

## 2023-11-08 DIAGNOSIS — Z3A26 26 weeks gestation of pregnancy: Secondary | ICD-10-CM

## 2023-11-08 DIAGNOSIS — Z8759 Personal history of other complications of pregnancy, childbirth and the puerperium: Secondary | ICD-10-CM | POA: Diagnosis not present

## 2023-11-08 DIAGNOSIS — Z3482 Encounter for supervision of other normal pregnancy, second trimester: Secondary | ICD-10-CM

## 2023-11-09 LAB — RPR: RPR Ser Ql: NONREACTIVE

## 2023-11-09 LAB — COMPREHENSIVE METABOLIC PANEL
ALT: 11 [IU]/L (ref 0–32)
AST: 13 [IU]/L (ref 0–40)
Albumin: 3.6 g/dL — ABNORMAL LOW (ref 4.0–5.0)
Alkaline Phosphatase: 67 [IU]/L (ref 44–121)
BUN/Creatinine Ratio: 14 (ref 9–23)
BUN: 6 mg/dL (ref 6–20)
Bilirubin Total: <0.2 mg/dL (ref 0.0–1.2)
CO2: 20 mmol/L (ref 20–29)
Calcium: 8.7 mg/dL (ref 8.7–10.2)
Chloride: 104 mmol/L (ref 96–106)
Creatinine, Ser: 0.42 mg/dL — ABNORMAL LOW (ref 0.57–1.00)
Globulin, Total: 2.2 g/dL (ref 1.5–4.5)
Glucose: 80 mg/dL (ref 70–99)
Potassium: 4.4 mmol/L (ref 3.5–5.2)
Sodium: 138 mmol/L (ref 134–144)
Total Protein: 5.8 g/dL — ABNORMAL LOW (ref 6.0–8.5)
eGFR: 138 mL/min/{1.73_m2} (ref >59)

## 2023-11-09 LAB — CBC
Hematocrit: 35.7 % (ref 34.0–46.6)
Hemoglobin: 11.9 g/dL (ref 11.1–15.9)
MCH: 30.2 pg (ref 26.6–33.0)
MCHC: 33.3 g/dL (ref 31.5–35.7)
MCV: 91 fL (ref 79–97)
Platelets: 215 10*3/uL (ref 150–450)
RBC: 3.94 x10E6/uL (ref 3.77–5.28)
RDW: 12.4 % (ref 11.7–15.4)
WBC: 9.4 10*3/uL (ref 3.4–10.8)

## 2023-11-09 LAB — HIV ANTIBODY (ROUTINE TESTING W REFLEX): HIV Screen 4th Generation wRfx: NONREACTIVE

## 2023-11-09 LAB — PROTEIN / CREATININE RATIO, URINE
Creatinine, Urine: 15.7 mg/dL
Protein, Ur: <4 mg/dL

## 2023-11-09 LAB — GLUCOSE TOLERANCE, 2 HOURS W/ 1HR
Glucose, 1 hour: 103 mg/dL (ref 70–179)
Glucose, 2 hour: 83 mg/dL (ref 70–152)
Glucose, Fasting: 78 mg/dL (ref 70–91)

## 2023-11-11 ENCOUNTER — Encounter: Payer: Self-pay | Admitting: Obstetrics and Gynecology

## 2023-12-20 ENCOUNTER — Ambulatory Visit: Payer: BC Managed Care – PPO | Admitting: Obstetrics & Gynecology

## 2023-12-20 ENCOUNTER — Ambulatory Visit: Payer: BC Managed Care – PPO | Attending: Obstetrics and Gynecology

## 2023-12-20 VITALS — BP 131/82 | HR 97 | Wt 222.0 lb

## 2023-12-20 DIAGNOSIS — Z3483 Encounter for supervision of other normal pregnancy, third trimester: Secondary | ICD-10-CM

## 2023-12-20 DIAGNOSIS — Z3A32 32 weeks gestation of pregnancy: Secondary | ICD-10-CM | POA: Diagnosis not present

## 2023-12-20 DIAGNOSIS — Z8759 Personal history of other complications of pregnancy, childbirth and the puerperium: Secondary | ICD-10-CM | POA: Diagnosis not present

## 2023-12-20 DIAGNOSIS — Z23 Encounter for immunization: Secondary | ICD-10-CM | POA: Diagnosis not present

## 2023-12-20 DIAGNOSIS — Z3480 Encounter for supervision of other normal pregnancy, unspecified trimester: Secondary | ICD-10-CM

## 2023-12-20 DIAGNOSIS — Z8679 Personal history of other diseases of the circulatory system: Secondary | ICD-10-CM | POA: Diagnosis present

## 2023-12-20 DIAGNOSIS — O09293 Supervision of pregnancy with other poor reproductive or obstetric history, third trimester: Secondary | ICD-10-CM | POA: Diagnosis not present

## 2023-12-20 NOTE — Progress Notes (Signed)
   PRENATAL VISIT NOTE  Subjective:  Rachel Marshall is a 28 y.o. G2P1001 at [redacted]w[redacted]d being seen today for ongoing prenatal care.  She is currently monitored for the following issues for this low-risk pregnancy and has Supervision of normal pregnancy and History of gestational hypertension on their problem list.  Patient reports no complaints.  Contractions: Irritability. Vag. Bleeding: None.  Movement: Present. Denies leaking of fluid.   The following portions of the patient's history were reviewed and updated as appropriate: allergies, current medications, past family history, past medical history, past social history, past surgical history and problem list.   Objective:   Vitals:   12/20/23 1319  BP: 131/82  Pulse: 97  Weight: 222 lb (100.7 kg)    Fetal Status: Fetal Heart Rate (bpm): 134   Movement: Present     General:  Alert, oriented and cooperative. Patient is in no acute distress.  Skin: Skin is warm and dry. No rash noted.   Cardiovascular: Normal heart rate noted  Respiratory: Normal respiratory effort, no problems with respiration noted  Abdomen: Soft, gravid, appropriate for gestational age.  Pain/Pressure: Present     Pelvic: Cervical exam deferred        Extremities: Normal range of motion.  Edema: Trace  Mental Status: Normal mood and affect. Normal behavior. Normal judgment and thought content.   Assessment and Plan:  Pregnancy: G2P1001 at [redacted]w[redacted]d 1. Supervision of other normal pregnancy, antepartum (Primary)  2. History of gestational hypertension Takign BPs at home--not crossing.  Baby Rx emailed.  Pt will send BPs weekly as she is on reduced schedule.    Preterm labor symptoms and general obstetric precautions including but not limited to vaginal bleeding, contractions, leaking of fluid and fetal movement were reviewed in detail with the patient. Please refer to After Visit Summary for other counseling recommendations.   RTC 3.5 weeks  Elsie Lincoln, MD

## 2023-12-23 ENCOUNTER — Other Ambulatory Visit: Payer: Self-pay

## 2023-12-23 DIAGNOSIS — Z3480 Encounter for supervision of other normal pregnancy, unspecified trimester: Secondary | ICD-10-CM

## 2024-01-17 ENCOUNTER — Ambulatory Visit (INDEPENDENT_AMBULATORY_CARE_PROVIDER_SITE_OTHER): Admitting: Obstetrics & Gynecology

## 2024-01-17 ENCOUNTER — Other Ambulatory Visit (HOSPITAL_COMMUNITY)
Admission: RE | Admit: 2024-01-17 | Discharge: 2024-01-17 | Disposition: A | Discharge status: Home / Self Care | Source: Ambulatory Visit | Attending: Obstetrics & Gynecology | Admitting: Obstetrics & Gynecology

## 2024-01-17 ENCOUNTER — Other Ambulatory Visit: Payer: Self-pay | Admitting: Obstetrics & Gynecology

## 2024-01-17 ENCOUNTER — Encounter: Payer: Self-pay | Admitting: Emergency Medicine

## 2024-01-17 VITALS — BP 134/79 | HR 82 | Wt 234.0 lb

## 2024-01-17 DIAGNOSIS — Z3493 Encounter for supervision of normal pregnancy, unspecified, third trimester: Secondary | ICD-10-CM | POA: Insufficient documentation

## 2024-01-17 DIAGNOSIS — Z3A36 36 weeks gestation of pregnancy: Secondary | ICD-10-CM

## 2024-01-17 DIAGNOSIS — Z3480 Encounter for supervision of other normal pregnancy, unspecified trimester: Secondary | ICD-10-CM

## 2024-01-17 DIAGNOSIS — Z3483 Encounter for supervision of other normal pregnancy, third trimester: Secondary | ICD-10-CM | POA: Diagnosis not present

## 2024-01-17 NOTE — Progress Notes (Signed)
   PRENATAL VISIT NOTE  Subjective:  Rachel Marshall is a 28 y.o. G2P1001 at [redacted]w[redacted]d being seen today for ongoing prenatal care.  She is currently monitored for the following issues for this low-risk pregnancy and has Supervision of normal pregnancy and History of gestational hypertension on their problem list.  Patient reports  increased bilateral LE edema not controlled with compression stocking or frequent elevation .  Contractions: Irregular. Vag. Bleeding: None.  Movement: Present. Denies leaking of fluid.   The following portions of the patient's history were reviewed and updated as appropriate: allergies, current medications, past family history, past medical history, past social history, past surgical history and problem list.   Objective:   Vitals:   01/17/24 0916  BP: 134/79  Pulse: 82  Weight: 234 lb (106.1 kg)    Fetal Status: Fetal Heart Rate (bpm): 135   Movement: Present     General:  Alert, oriented and cooperative. Patient is in no acute distress.  Skin: Skin is warm and dry. No rash noted.   Cardiovascular: Normal heart rate noted  Respiratory: Normal respiratory effort, no problems with respiration noted  Abdomen: Soft, gravid, appropriate for gestational age.  Pain/Pressure: Present     Pelvic: Cervical exam performed in the presence of a chaperone      3/70/-2 cervix soft and still posterior.  vertex  Extremities: Normal range of motion.  Edema: Mild pitting, slight indentation  Mental Status: Normal mood and affect. Normal behavior. Normal judgment and thought content.   Assessment and Plan:  Pregnancy: G2P1001 at [redacted]w[redacted]d 1. [redacted] weeks gestation of pregnancy (Primary) Cultures today.  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  Written out of work for LE swelling.   Term labor symptoms and general obstetric precautions  including but not limited to vaginal bleeding, contractions, leaking of fluid and fetal movement were reviewed in detail with the patient. Please refer to After Visit Summary for other counseling recommendations.   RTC in 1 week  Glorietta Lark, MD

## 2024-01-17 NOTE — Progress Notes (Signed)
1347

## 2024-01-18 ENCOUNTER — Encounter: Payer: Self-pay | Admitting: Obstetrics & Gynecology

## 2024-01-18 LAB — CERVICOVAGINAL ANCILLARY ONLY
Chlamydia: NEGATIVE
Comment: NEGATIVE
Comment: NORMAL
Neisseria Gonorrhea: NEGATIVE

## 2024-01-19 LAB — STREP GP B NAA: Strep Gp B NAA: NEGATIVE

## 2024-01-24 ENCOUNTER — Ambulatory Visit (INDEPENDENT_AMBULATORY_CARE_PROVIDER_SITE_OTHER): Admitting: Obstetrics & Gynecology

## 2024-01-24 VITALS — BP 129/76 | HR 85 | Wt 237.0 lb

## 2024-01-24 DIAGNOSIS — O368131 Decreased fetal movements, third trimester, fetus 1: Secondary | ICD-10-CM

## 2024-01-24 DIAGNOSIS — Z3A37 37 weeks gestation of pregnancy: Secondary | ICD-10-CM | POA: Diagnosis not present

## 2024-01-24 DIAGNOSIS — Z8759 Personal history of other complications of pregnancy, childbirth and the puerperium: Secondary | ICD-10-CM | POA: Diagnosis not present

## 2024-01-24 NOTE — Progress Notes (Signed)
   PRENATAL VISIT NOTE  Subjective:  Rachel Marshall is a 28 y.o. G2P1001 at [redacted]w[redacted]d being seen today for ongoing prenatal care.  She is currently monitored for the following issues for this high-risk pregnancy and has Supervision of normal pregnancy and History of gestational hypertension on their problem list.  Patient reports  hand and LE edema .  Contractions: Irritability. Vag. Bleeding: None.  Movement: (!) Decreased. Denies leaking of fluid.   The following portions of the patient's history were reviewed and updated as appropriate: allergies, current medications, past family history, past medical history, past social history, past surgical history and problem list.   Objective:   Vitals:   01/24/24 1114  BP: 129/76  Pulse: 85  Weight: 237 lb (107.5 kg)    Fetal Status: Fetal Heart Rate (bpm): 137   Movement: (!) Decreased     General:  Alert, oriented and cooperative. Patient is in no acute distress.  Skin: Skin is warm and dry. No rash noted.   Cardiovascular: Normal heart rate noted  Respiratory: Normal respiratory effort, no problems with respiration noted  Abdomen: Soft, gravid, appropriate for gestational age.  Pain/Pressure: Present     Pelvic: Cervical exam deferred        Extremities: Normal range of motion.  Edema: Mild pitting, slight indentation  Mental Status: Normal mood and affect. Normal behavior. Normal judgment and thought content.   Assessment and Plan:  Pregnancy: G2P1001 at [redacted]w[redacted]d 1. [redacted] weeks gestation of pregnancy (Primary) Borderline BP with increased swelling--now in hands per patient Decreased fetal movement Fetal kick counts usually 10 per hour and she feels movement is overall less. Considering induction at 39 weeks.   Baseline:  130 Accelerations: present Decelerations: absent Variability: moderate Interpretation:  Reactive NST    Term labor symptoms and general obstetric precautions including but not limited to vaginal bleeding,  contractions, leaking of fluid and fetal movement were reviewed in detail with the patient. Please refer to After Visit Summary for other counseling recommendations.   No follow-ups on file.  Future Appointments  Date Time Provider Department Center  01/31/2024  1:50 PM Rik Chasten, MD [facility]  02/07/2024  1:10 PM Rik Chasten, MD [facility]    Glorietta Lark, MD

## 2024-01-26 ENCOUNTER — Inpatient Hospital Stay (HOSPITAL_COMMUNITY)
Admission: AD | Admit: 2024-01-26 | Discharge: 2024-01-27 | DRG: 807 | Disposition: A | Discharge status: Home / Self Care | Attending: Family Medicine | Admitting: Family Medicine

## 2024-01-26 ENCOUNTER — Inpatient Hospital Stay (HOSPITAL_COMMUNITY): Admitting: Anesthesiology

## 2024-01-26 ENCOUNTER — Encounter (HOSPITAL_COMMUNITY): Payer: Self-pay | Admitting: Obstetrics and Gynecology

## 2024-01-26 ENCOUNTER — Inpatient Hospital Stay (HOSPITAL_COMMUNITY): Admission: RE | Admit: 2024-01-26 | Payer: BC Managed Care – PPO | Source: Home / Self Care

## 2024-01-26 DIAGNOSIS — O139 Gestational [pregnancy-induced] hypertension without significant proteinuria, unspecified trimester: Secondary | ICD-10-CM

## 2024-01-26 DIAGNOSIS — Z7982 Long term (current) use of aspirin: Secondary | ICD-10-CM

## 2024-01-26 DIAGNOSIS — Z8249 Family history of ischemic heart disease and other diseases of the circulatory system: Secondary | ICD-10-CM | POA: Diagnosis not present

## 2024-01-26 DIAGNOSIS — Z302 Encounter for sterilization: Secondary | ICD-10-CM

## 2024-01-26 DIAGNOSIS — R030 Elevated blood-pressure reading, without diagnosis of hypertension: Secondary | ICD-10-CM | POA: Diagnosis present

## 2024-01-26 DIAGNOSIS — O134 Gestational [pregnancy-induced] hypertension without significant proteinuria, complicating childbirth: Principal | ICD-10-CM | POA: Diagnosis present

## 2024-01-26 DIAGNOSIS — Z3A38 38 weeks gestation of pregnancy: Secondary | ICD-10-CM

## 2024-01-26 HISTORY — DX: Gestational (pregnancy-induced) hypertension without significant proteinuria, unspecified trimester: O13.9

## 2024-01-26 LAB — PROTEIN / CREATININE RATIO, URINE
Creatinine, Urine: 33.6 mg/dL
Protein, Ur: 9.3 mg/dL
Protein/Creat Ratio: 277 mg/g{creat} — ABNORMAL HIGH (ref 0–200)

## 2024-01-26 LAB — COMPREHENSIVE METABOLIC PANEL WITH GFR
ALT: 16 IU/L (ref 0–32)
AST: 22 IU/L (ref 0–40)
Albumin: 3.4 g/dL — ABNORMAL LOW (ref 4.0–5.0)
Alkaline Phosphatase: 110 IU/L (ref 44–121)
BUN/Creatinine Ratio: 11 (ref 9–23)
BUN: 5 mg/dL — ABNORMAL LOW (ref 6–20)
Bilirubin Total: <0.2 mg/dL (ref 0.0–1.2)
CO2: 20 mmol/L (ref 20–29)
Calcium: 9.1 mg/dL (ref 8.7–10.2)
Chloride: 107 mmol/L — ABNORMAL HIGH (ref 96–106)
Creatinine, Ser: 0.45 mg/dL — ABNORMAL LOW (ref 0.57–1.00)
Globulin, Total: 1.9 g/dL (ref 1.5–4.5)
Glucose: 69 mg/dL — ABNORMAL LOW (ref 70–99)
Potassium: 4.2 mmol/L (ref 3.5–5.2)
Sodium: 140 mmol/L (ref 134–144)
Total Protein: 5.3 g/dL — ABNORMAL LOW (ref 6.0–8.5)
eGFR: 136 mL/min/{1.73_m2} (ref >59)

## 2024-01-26 LAB — CBC
HCT: 33.9 % — ABNORMAL LOW (ref 36.0–46.0)
Hematocrit: 34.9 % (ref 34.0–46.6)
Hemoglobin: 11.8 g/dL (ref 11.1–15.9)
Hemoglobin: 11.5 g/dL — ABNORMAL LOW (ref 12.0–15.0)
MCH: 30.1 pg (ref 26.6–33.0)
MCH: 30.2 pg (ref 26.0–34.0)
MCHC: 33.8 g/dL (ref 31.5–35.7)
MCHC: 33.9 g/dL (ref 30.0–36.0)
MCV: 89 fL (ref 79–97)
MCV: 89 fL (ref 80.0–100.0)
Platelets: 175 10*3/uL (ref 150–450)
Platelets: 165 10*3/uL (ref 150–400)
RBC: 3.92 x10E6/uL (ref 3.77–5.28)
RBC: 3.81 MIL/uL — ABNORMAL LOW (ref 3.87–5.11)
RDW: 13.2 % (ref 11.7–15.4)
RDW: 13.6 % (ref 11.5–15.5)
WBC: 7.6 10*3/uL (ref 3.4–10.8)
WBC: 8.8 10*3/uL (ref 4.0–10.5)
nRBC: 0 % (ref 0.0–0.2)

## 2024-01-26 LAB — RPR: RPR Ser Ql: NONREACTIVE

## 2024-01-26 LAB — TYPE AND SCREEN
ABO/RH(D): B POS
Antibody Screen: NEGATIVE

## 2024-01-26 MED ORDER — OXYTOCIN BOLUS FROM INFUSION
333.0000 mL | Freq: Once | INTRAVENOUS | Status: AC
  Administered 2024-01-26: 333 mL via INTRAVENOUS

## 2024-01-26 MED ORDER — FUROSEMIDE 20 MG PO TABS
20.0000 mg | ORAL_TABLET | Freq: Every day | ORAL | Status: DC
  Administered 2024-01-27: 20 mg via ORAL
  Filled 2024-01-26: qty 1

## 2024-01-26 MED ORDER — LACTATED RINGERS IV SOLN: INTRAVENOUS | Status: DC

## 2024-01-26 MED ORDER — LIDOCAINE HCL (PF) 1 % IJ SOLN: 30.0000 mL | INTRAMUSCULAR | Status: DC | PRN

## 2024-01-26 MED ORDER — OXYTOCIN-SODIUM CHLORIDE 30-0.9 UT/500ML-% IV SOLN: 2.5000 [IU]/h | INTRAVENOUS | Status: DC

## 2024-01-26 MED ORDER — LIDOCAINE HCL (PF) 1 % IJ SOLN
INTRAMUSCULAR | Status: DC | PRN
  Administered 2024-01-26: 11 mL via EPIDURAL

## 2024-01-26 MED ORDER — DIPHENHYDRAMINE HCL 25 MG PO CAPS: 25.0000 mg | ORAL_CAPSULE | Freq: Four times a day (QID) | ORAL | Status: DC | PRN

## 2024-01-26 MED ORDER — BUPIVACAINE HCL (PF) 0.25 % IJ SOLN
INTRAMUSCULAR | Status: DC | PRN
  Administered 2024-01-26: 10 mL via EPIDURAL

## 2024-01-26 MED ORDER — OXYCODONE-ACETAMINOPHEN 5-325 MG PO TABS: 2.0000 | ORAL_TABLET | ORAL | Status: DC | PRN

## 2024-01-26 MED ORDER — MAGNESIUM HYDROXIDE 400 MG/5ML PO SUSP: 30.0000 mL | ORAL | Status: DC | PRN

## 2024-01-26 MED ORDER — FENTANYL-BUPIVACAINE-NACL 0.5-0.125-0.9 MG/250ML-% EP SOLN
12.0000 mL/h | EPIDURAL | Status: DC | PRN
  Administered 2024-01-26: 12 mL/h via EPIDURAL
  Filled 2024-01-26: qty 250

## 2024-01-26 MED ORDER — ONDANSETRON HCL 4 MG PO TABS: 4.0000 mg | ORAL_TABLET | ORAL | Status: DC | PRN

## 2024-01-26 MED ORDER — LACTATED RINGERS IV SOLN
500.0000 mL | Freq: Once | INTRAVENOUS | Status: AC
  Administered 2024-01-26: 500 mL via INTRAVENOUS

## 2024-01-26 MED ORDER — LACTATED RINGERS IV SOLN: 500.0000 mL | INTRAVENOUS | Status: DC | PRN

## 2024-01-26 MED ORDER — LABETALOL HCL 5 MG/ML IV SOLN: 80.0000 mg | INTRAVENOUS | Status: DC | PRN

## 2024-01-26 MED ORDER — ONDANSETRON HCL 4 MG/2ML IJ SOLN: 4.0000 mg | Freq: Four times a day (QID) | INTRAMUSCULAR | Status: DC | PRN

## 2024-01-26 MED ORDER — IBUPROFEN 600 MG PO TABS
600.0000 mg | ORAL_TABLET | Freq: Four times a day (QID) | ORAL | Status: DC
  Administered 2024-01-26 – 2024-01-27 (×3): 600 mg via ORAL
  Filled 2024-01-26 (×3): qty 1

## 2024-01-26 MED ORDER — ACETAMINOPHEN 325 MG PO TABS: 650.0000 mg | ORAL_TABLET | ORAL | Status: DC | PRN

## 2024-01-26 MED ORDER — EPHEDRINE 5 MG/ML INJ: 10.0000 mg | INTRAVENOUS | Status: DC | PRN

## 2024-01-26 MED ORDER — PHENYLEPHRINE 80 MCG/ML (10ML) SYRINGE FOR IV PUSH (FOR BLOOD PRESSURE SUPPORT): 80.0000 ug | PREFILLED_SYRINGE | INTRAVENOUS | Status: DC | PRN

## 2024-01-26 MED ORDER — DIBUCAINE (PERIANAL) 1 % EX OINT: 1.0000 | TOPICAL_OINTMENT | CUTANEOUS | Status: DC | PRN

## 2024-01-26 MED ORDER — OXYCODONE HCL 5 MG PO TABS: 10.0000 mg | ORAL_TABLET | ORAL | Status: DC | PRN

## 2024-01-26 MED ORDER — OXYCODONE-ACETAMINOPHEN 5-325 MG PO TABS: 1.0000 | ORAL_TABLET | ORAL | Status: DC | PRN

## 2024-01-26 MED ORDER — BENZOCAINE-MENTHOL 20-0.5 % EX AERO: 1.0000 | INHALATION_SPRAY | CUTANEOUS | Status: DC | PRN

## 2024-01-26 MED ORDER — WITCH HAZEL-GLYCERIN EX PADS: 1.0000 | MEDICATED_PAD | CUTANEOUS | Status: DC | PRN

## 2024-01-26 MED ORDER — SIMETHICONE 80 MG PO CHEW: 80.0000 mg | CHEWABLE_TABLET | ORAL | Status: DC | PRN

## 2024-01-26 MED ORDER — SOD CITRATE-CITRIC ACID 500-334 MG/5ML PO SOLN: 30.0000 mL | ORAL | Status: DC | PRN

## 2024-01-26 MED ORDER — DIPHENHYDRAMINE HCL 50 MG/ML IJ SOLN: 12.5000 mg | INTRAMUSCULAR | Status: DC | PRN

## 2024-01-26 MED ORDER — TERBUTALINE SULFATE 1 MG/ML IJ SOLN: 0.2500 mg | Freq: Once | INTRAMUSCULAR | Status: DC | PRN

## 2024-01-26 MED ORDER — OXYTOCIN-SODIUM CHLORIDE 30-0.9 UT/500ML-% IV SOLN
1.0000 m[IU]/min | INTRAVENOUS | Status: DC
  Administered 2024-01-26: 2 m[IU]/min via INTRAVENOUS
  Filled 2024-01-26: qty 500

## 2024-01-26 MED ORDER — PRENATAL MULTIVITAMIN CH
1.0000 | ORAL_TABLET | Freq: Every day | ORAL | Status: DC
  Administered 2024-01-27: 1 via ORAL
  Filled 2024-01-26: qty 1

## 2024-01-26 MED ORDER — TETANUS-DIPHTH-ACELL PERTUSSIS 5-2.5-18.5 LF-MCG/0.5 IM SUSY: 0.5000 mL | PREFILLED_SYRINGE | Freq: Once | INTRAMUSCULAR | Status: DC

## 2024-01-26 MED ORDER — ACETAMINOPHEN 500 MG PO TABS: 1000.0000 mg | ORAL_TABLET | Freq: Four times a day (QID) | ORAL | Status: DC | PRN

## 2024-01-26 MED ORDER — COCONUT OIL OIL: 1.0000 | TOPICAL_OIL | Status: DC | PRN

## 2024-01-26 MED ORDER — MEASLES, MUMPS & RUBELLA VAC IJ SOLR: 0.5000 mL | Freq: Once | INTRAMUSCULAR | Status: DC

## 2024-01-26 MED ORDER — FAMOTIDINE 20 MG PO TABS
40.0000 mg | ORAL_TABLET | Freq: Once | ORAL | Status: AC
  Administered 2024-01-27: 40 mg via ORAL
  Filled 2024-01-26: qty 2

## 2024-01-26 MED ORDER — METOCLOPRAMIDE HCL 10 MG PO TABS
10.0000 mg | ORAL_TABLET | Freq: Once | ORAL | Status: AC
  Administered 2024-01-27: 10 mg via ORAL
  Filled 2024-01-26: qty 1

## 2024-01-26 MED ORDER — POTASSIUM CHLORIDE CRYS ER 20 MEQ PO TBCR
20.0000 meq | EXTENDED_RELEASE_TABLET | Freq: Every day | ORAL | Status: DC
Start: 2024-01-27 — End: 2024-01-27
  Administered 2024-01-27: 20 meq via ORAL
  Filled 2024-01-26: qty 1

## 2024-01-26 MED ORDER — LABETALOL HCL 5 MG/ML IV SOLN: 20.0000 mg | INTRAVENOUS | Status: DC | PRN

## 2024-01-26 MED ORDER — OXYCODONE HCL 5 MG PO TABS: 5.0000 mg | ORAL_TABLET | ORAL | Status: DC | PRN

## 2024-01-26 MED ORDER — HYDRALAZINE HCL 20 MG/ML IJ SOLN: 10.0000 mg | INTRAMUSCULAR | Status: DC | PRN

## 2024-01-26 MED ORDER — ONDANSETRON HCL 4 MG/2ML IJ SOLN
4.0000 mg | INTRAMUSCULAR | Status: DC | PRN
Start: 2024-01-26 — End: 2024-01-27

## 2024-01-26 MED ORDER — LABETALOL HCL 5 MG/ML IV SOLN: 40.0000 mg | INTRAVENOUS | Status: DC | PRN

## 2024-01-26 NOTE — Progress Notes (Signed)
 To BS via w/c for IOL

## 2024-01-26 NOTE — Discharge Summary (Signed)
 Postpartum Discharge Summary  Date of Service updated***     Patient Name: Rachel Marshall DOB: [birth year] MRN: [id number]  Date of admission: 01/26/2024 Delivery date:01/26/2024 Delivering provider: Sherwin Donate  Date of discharge: 01/26/2024  Admitting diagnosis: Gestational hypertension [O13.9] Intrauterine pregnancy: [redacted]w[redacted]d     Secondary diagnosis:  Principal Problem:   Gestational hypertension Active Problems:   Vaginal delivery   Request for sterilization  Additional problems: ***    Discharge diagnosis: Term Pregnancy Delivered and Gestational Hypertension                                              Post partum procedures:{Postpartum procedures:23558} Augmentation: AROM and Pitocin  Complications: None  Hospital course: Induction of Labor With Vaginal Delivery   28 y.o. yo G2P2002 at [redacted]w[redacted]d was admitted to the hospital 01/26/2024 for induction of labor.  Indication for induction: Gestational hypertension.  Patient had an labor course complicated by nothing. Membrane Rupture Time/Date: 11:06 AM,01/26/2024  Delivery Method:Vaginal, Spontaneous Operative Delivery:N/A Episiotomy: None Lacerations:  None Details of delivery can be found in separate delivery note.  Patient had a postpartum course complicated by***. Patient is discharged home 01/26/24.  Newborn Data: Birth date:01/26/2024 Birth time:12:39 PM Gender:Female Living status:Living Apgars:9 ,9  Weight:3240 g  Magnesium Sulfate received: {Mag received:30440022} BMZ received: No Rhophylac:N/A MMR:N/A T-DaP:Given prenatally Flu: No RSV Vaccine received: No Transfusion:{Transfusion received:30440034}  Immunizations received: Immunization History  Administered Date(s) Administered   Tdap 02/27/2020, 02/28/2020, 12/20/2023    Physical exam  Vitals:   01/26/24 1315 01/26/24 1330 01/26/24 1345 01/26/24 1400  BP: 116/67 127/72 129/78 135/70  Pulse: 70 64 61 66  Resp:      Temp:      TempSrc:      SpO2:       Weight:      Height:       General: {Exam; general:21111117} Lochia: {Desc; appropriate/inappropriate:30686::"appropriate"} Uterine Fundus: {Desc; firm/soft:30687} Incision: {Exam; incision:21111123} DVT Evaluation: {Exam; dvt:2111122} Labs: Lab Results  Component Value Date   WBC 8.8 01/26/2024   HGB 11.5 (L) 01/26/2024   HCT 33.9 (L) 01/26/2024   MCV 89.0 01/26/2024   PLT 165 01/26/2024      Latest Ref Rng & Units 11/08/2023    8:58 AM  CMP  Glucose 70 - 99 mg/dL 80   BUN 6 - 20 mg/dL 6   Creatinine 8.29 - 5.62 mg/dL 1.30   Sodium 865 - 784 mmol/L 138   Potassium 3.5 - 5.2 mmol/L 4.4   Chloride 96 - 106 mmol/L 104   CO2 20 - 29 mmol/L 20   Calcium  8.7 - 10.2 mg/dL 8.7   Total Protein 6.0 - 8.5 g/dL 5.8   Total Bilirubin 0.0 - 1.2 mg/dL <6.9   Alkaline Phos 44 - 121 IU/L 67   AST 0 - 40 IU/L 13   ALT 0 - 32 IU/L 11    Edinburgh Score:    03/09/2020    7:44 PM  Edinburgh Postnatal Depression Scale Screening Tool  I have been able to laugh and see the funny side of things. 0  I have looked forward with enjoyment to things. 0  I have blamed myself unnecessarily when things went wrong. 1  I have been anxious or worried for no good reason. 0  I have felt scared or panicky for no good reason. 0  Things  have been getting on top of me. 0  I have been so unhappy that I have had difficulty sleeping. 1  I have felt sad or miserable. 0  I have been so unhappy that I have been crying. 0  The thought of harming myself has occurred to me. 0  Edinburgh Postnatal Depression Scale Total 2   No data recorded  After visit meds:  Allergies as of 01/26/2024   No Known Allergies   Med Rec must be completed prior to using this [facility]***        Discharge home in stable condition Infant Feeding: Breast-Pumping  Infant Disposition:{CHL IP OB HOME WITH ZOXWRU:04540} Discharge instruction: per After Visit Summary and Postpartum booklet. Activity: Advance as tolerated.  Pelvic rest for 6 weeks.  Diet: {OB JWJX:91478295} Future Appointments: Future Appointments  Date Time Provider Department Center  01/31/2024  1:50 PM Rik Chasten, MD [facility]  02/07/2024  1:10 PM Rik Chasten, MD [facility]   Follow up Visit:  [facility] Message sent 01/26/24  Please schedule this patient for a In person postpartum visit in 4 weeks with the following provider: Any provider. Additional Postpartum F/U: BP check in 3 days   High risk pregnancy complicated by: HTN Delivery mode:  Vaginal, Spontaneous Anticipated Birth Control:  BTL done [facility]   01/26/2024 Luken Shadowens  Felipe Horton, CNM

## 2024-01-26 NOTE — H&P (Addendum)
 OBSTETRIC ADMISSION HISTORY AND PHYSICAL  Rachel Marshall is a 28 y.o. female G2P1001 with IUP at [redacted]w[redacted]d by 10wk US  presenting for elevated BP at [facility], consistently elevated here. Meets criteria for gHTN.  At [facility] they recommended IOL and she left AMA to come to [facility] due to this being her preferred delivery location and also her providers are here.   She reports +FMs, No LOF, no VB, no blurry vision, headaches or peripheral edema, and RUQ pain.  She plans on breast feeding. She request BTS for birth control. She received her prenatal care at [facility]  Dating: By 10wk us  --->  Estimated Date of Delivery: 02/09/24  Sono:   @[redacted]w[redacted]d , CWD, normal anatomy, vertex presentation, , 2043g, 41% EFW   Prenatal History/Complications:  Patient Active Problem List   Diagnosis Date Noted   History of gestational hypertension 08/09/2023   Supervision of normal pregnancy 07/19/2023     NURSING  PROVIDER  Office Location [place] Dating by U/S at 10 wks  [facility] Model Traditional Anatomy U/S Normal with nml f/u growth  Initiated care at  [employer]  English              LAB RESULTS   Support Person Lilli [facility] [employer] NIPS: LR F AFP: nml    NT/IT (FT only)     Carrier Screen Horizon: negative  Rhogam  B/Positive/-- (11/18 1035) A1C/GTT Early:  Third trimester: 2 hr wnl  Flu Vaccine Declines    TDaP Vaccine  12/20/23 Blood Type B/Positive/-- (11/18 1035)  Covid Vaccine  Antibody Negative (11/18 1035)  RSV Vaccine  Rubella 5.43 (11/18 1035)  Feeding Plan Breast RPR Non Reactive (11/18 1035)  Contraception Tubal HBsAg Negative (11/18 1035)  Circumcision NA HIV Non Reactive (11/18 1035)  Pediatrician  [facility] HCVAb Non Reactive (11/18 1035)  Prenatal Classes       Pap Diagnosis  Date Value Ref Range Status  08/09/2023   Final   - Negative for intraepithelial lesion or malignancy (NILM)    BTL Consent  GC/CT Initial:   36wks:    VBAC Consent  GBS  neg       DME Rx [  ] BP cuff [ ]  Weight Scale Waterbirth  [ ]  Class [ ]  Consent [ ]  CNM visit  PHQ9 & GAD7 [  ] new OB [  ] 28 weeks  [  ] 36 weeks Induction  [ ]  Orders Entered [ ] Foley Y/N    Past Medical History: Past Medical History:  Diagnosis Date   History of syncope    Orthostatic hypotension 2020   Seizures (HCC)    one episode in 2012    Past Surgical History: Past Surgical History:  Procedure Laterality Date   WISDOM TOOTH EXTRACTION      Obstetrical History: OB History     Gravida  2   Para  1   Term  1   Preterm      AB      Living  1      SAB      IAB      Ectopic      Multiple  0   Live Births  1           Social History Social History   Socioeconomic History   Marital status: Married    Spouse name: Not on file   Number of  children: Not on file   Years of education: Not on file   Highest education level: Not on file  Occupational History   Not on file  Tobacco Use   Smoking status: Never   Smokeless tobacco: Never  Vaping Use   Vaping status: Never Used  Substance and Sexual Activity   Alcohol use: Not Currently   Drug use: Never   Sexual activity: Yes    Partners: Male    Birth control/protection: None  Other Topics Concern   Not on file  Social History Narrative   Not on file   Social Drivers of Health   Financial Resource Strain: Low Risk  (12/13/2023)   Received from [facility]   Overall Financial Resource Strain (CARDIA)    Difficulty of Paying Living Expenses: Not hard at all  Food Insecurity: No Food Insecurity (01/25/2024)   Received from [facility]   Hunger Vital Sign    Worried About Running Out of Food in the Last Year: Never true    Ran Out of Food in the Last Year: Never true  Transportation Needs: No Transportation Needs (01/25/2024)   Received from [facility]    Lack of Transportation (Medical): No    Lack of Transportation (Non-Medical): No  Physical Activity: Not on file   Stress: Not on file  Social Connections: Unknown (06/20/2022)   Received from [facility]   Social Network    Social Network: Not on file    Family History: Family History  Problem Relation Age of Onset   Breast cancer Maternal Grandmother    Hypertension Mother     Allergies: No Known Allergies  Medications Prior to Admission  Medication Sig Dispense Refill Last Dose/Taking   aspirin  81 MG chewable tablet Chew 1 tablet (81 mg total) by mouth daily. Start at 16 weeks 90 tablet 3 01/25/2024   Prenatal Vit-Fe Fumarate-FA (PRENATAL VITAMINS PO) Take by mouth.    01/25/2024     Review of Systems   All systems reviewed and negative except as stated in HPI  Blood pressure 139/72, pulse 92, temperature 97.7 F (36.5 C), resp. rate 17, height 5\' 9"  (1.753 m), weight 107.5 kg, last menstrual period 05/10/2023, SpO2 100%, currently breastfeeding.  Patient Vitals for the past 24 hrs:  BP Temp Pulse Resp SpO2 Height Weight  01/26/24 0216 139/72 -- 92 -- -- -- --  01/26/24 0212 (!) 141/71 -- 86 -- -- -- --  01/26/24 0150 (!) 144/94 -- -- -- -- -- --  01/26/24 0148 -- 97.7 F (36.5 C) 100 17 100 % 5\' 9"  (1.753 m) 107.5 kg    General appearance: alert, cooperative, and appears stated age Lungs: clear to auscultation bilaterally Heart: regular rate and rhythm Abdomen: soft, non-tender; bowel sounds normal Pelvic: adequate- see cervical exam Extremities: Homans sign is negative, no sign of DVT DTR's WNL Presentation: cephalic Fetal monitoringBaseline: 150 bpm, Variability: Good {> 6 bpm), Accelerations: Reactive, and Decelerations: Absent  Uterine activityNone Dilation: 3.5 Effacement (%): 70 Station: -3 Exam by:: Charleston Conrad MD   Prenatal labs: ABO, Rh: B/Positive/-- (11/18 1035) Antibody: Negative (11/18 1035) Rubella: 5.43 (11/18 1035) RPR: Non Reactive (02/17 0858)  HBsAg: Negative (11/18 1035)  HIV: Non Reactive (02/17 0858)  GBS: Negative/-- (04/28 0000)     Lab Results  Component Value Date   GBS Negative 01/17/2024   GTT WNL Genetic screening  LR female Anatomy US  WNL  Immunization History  Administered Date(s) Administered  Tdap 02/27/2020, 02/28/2020, 12/20/2023    Prenatal Transfer Tool  Maternal Diabetes: No Genetic Screening: Normal Maternal Ultrasounds/Referrals: Normal Fetal Ultrasounds or other Referrals:  None Maternal Substance Abuse:  No Significant Maternal Medications:  None Significant Maternal Lab Results: Group B Strep negative Number of Prenatal Visits:greater than 3 verified prenatal visits Maternal Vaccinations:TDap Other Comments:  None   No results found for this or any previous visit (from the past 24 hours).  Patient Active Problem List   Diagnosis Date Noted   History of gestational hypertension 08/09/2023   Supervision of normal pregnancy 07/19/2023    Assessment/Plan:  Rachel Marshall is a 28 y.o. G2P1001 at [redacted]w[redacted]d here for IOL 2/2 GHTN  #Labor: Discussed options with patient. Recommended pitocin  and then ROM. Patient agreeable.  #Pain: Planning epidural, may have when desired.  #FWB: Cat 1 #GBS status:  negative #Feeding: Breastmilk  #Reproductive Life planning: Tubal Ligation, privately insured #Circ:  not applicable  #GHTN: seen at [facility] for elevated BP. Labs reviewed in care everywhere- WNL. Having mild HA currently but patient think this might be sleep related. Just ate as well. Monitor for worsening HA.  Abner Ables, MD  01/26/2024, 2:25 AM

## 2024-01-26 NOTE — Anesthesia Preprocedure Evaluation (Signed)
Anesthesia Evaluation  Patient identified by MRN, date of birth, ID band Patient awake    Reviewed: Allergy & Precautions, Patient's Chart, lab work & pertinent test results  Airway Mallampati: II       Dental no notable dental hx.    Pulmonary    Pulmonary exam normal        Cardiovascular hypertension, Normal cardiovascular exam     Neuro/Psych Seizures -, Well Controlled,     GI/Hepatic   Endo/Other    Renal/GU      Musculoskeletal   Abdominal   Peds  Hematology   Anesthesia Other Findings   Reproductive/Obstetrics (+) Pregnancy                             Anesthesia Physical Anesthesia Plan  ASA: II  Anesthesia Plan: Epidural   Post-op Pain Management:    Induction:   PONV Risk Score and Plan: 0  Airway Management Planned: Natural Airway  Additional Equipment: None  Intra-op Plan:   Post-operative Plan:   Informed Consent: I have reviewed the patients History and Physical, chart, labs and discussed the procedure including the risks, benefits and alternatives for the proposed anesthesia with the patient or authorized representative who has indicated his/her understanding and acceptance.       Plan Discussed with:   Anesthesia Plan Comments: (Lab Results      Component                Value               Date                      WBC                      13.8 (H)            03/09/2020                HGB                      11.3 (L)            03/09/2020                HCT                      34.7 (L)            03/09/2020                MCV                      87.8                03/09/2020                PLT                      203                 03/09/2020           )        Anesthesia Quick Evaluation

## 2024-01-26 NOTE — MAU Note (Addendum)
 MAU Triage Note  .Rachel Marshall is a 28 y.o. at [redacted]w[redacted]d here in MAU reporting having hx gHTN with first pregnancy. No issues with B/P until Tues when she took her b/p at home and it was 142/77. She had been told to go to the ED if her b/p was elevated so she went to [facility] in [place]. They did PreE workup and recommended induction. Pt is [facility] pt so she preferred to come here. She signed out AMA and drove here for what she thinks is IOL. Denies VB or LOF. Reports good FM. Having some ctxs. Reports now getting a slight h/a. No meds for h/a. Last sve was 4/28 and she was 3cm  LMP: n/a Onset of complaint: Tues Pain score: 5 for ctx and 2 for h/a Vitals:   01/26/24 0148 01/26/24 0150  BP:  (!) 144/94  Pulse: 100   Resp: 17   Temp: 97.7 F (36.5 C)   SpO2: 100%      FHT: 145  Lab orders placed from triage: labor eval

## 2024-01-26 NOTE — Anesthesia Procedure Notes (Signed)
 Epidural Patient location during procedure: OB Start time: 01/26/2024 10:13 AM End time: 01/26/2024 10:30 AM  Staffing Anesthesiologist: Earvin Goldberg, MD Performed: anesthesiologist   Preanesthetic Checklist Completed: patient identified, IV checked, site marked, risks and benefits discussed, surgical consent, monitors and equipment checked, pre-op evaluation and timeout performed  Epidural Patient position: sitting Prep: ChloraPrep Patient monitoring: heart rate, cardiac monitor, continuous pulse ox and blood pressure Approach: midline Location: L2-L3 Injection technique: LOR saline  Needle:  Needle type: Tuohy  Needle gauge: 17 G Needle length: 9 cm Needle insertion depth: 6 cm Catheter type: closed end flexible Catheter size: 20 Guage Catheter at skin depth: 10 cm Test dose: negative  Assessment Events: blood not aspirated, injection not painful, no injection resistance, no paresthesia and negative IV test  Additional Notes Reason for block:procedure for pain

## 2024-01-26 NOTE — Lactation Note (Signed)
 This note was copied from a baby's chart. Lactation Consultation Note  Patient Name: Rachel Marshall [id number] Date: 01/26/2024 Age:28 years Reason for consult: Exclusive pumping and bottle feeding;Initial assessment;Early term 37-38.6wks. See [name] MR: hx of GHTN [name] informed LC she has decided to pump only and formula feed infant. [name] expressed 8 mls of EBM using a hand pump,FOB pace feed infant [name] expressed breast milk while LC assisted [name] up with using the DEBP. [name] declined donor milk, if she has supplement infant in addition to her EBM she prefers to using formula. [name] fitted with 21 mm breast flange and plans to continue to pump every 3 hours for 15 minutes. [name] will continue to feed infant by hunger cues, 8+ times within 24 hours. LC discussed importance of maternal rest, meals and hydration. [name] pumped and breastfeed her 1st child for 6 months, infant was in NICU for 22 days.   Maternal Data Has patient been taught Hand Expression?: No Does the patient have breastfeeding experience prior to this delivery?: Yes How long did the patient breastfeed?: Per [name], she BF and pumped 6 months, 1st child was in NICU for 22 days  Feeding Mother's Current Feeding Choice: Breast Milk and Formula  LATCH Score  Lath not observed [name] current feeding choice is Pumping  Only and formula feeding infant.                    Lactation Tools Discussed/Used Tools: Flanges;Pump Flange Size: 21 Breast pump type: Double-Electric Breast Pump Pump Education: Setup, frequency, and cleaning;Milk Storage Reason for Pumping: [name] only wants to pump and bottle feed infant. Pumping frequency: [name] will continue to pump every 3 hours for 15 mintutes.  Interventions Interventions: Breast feeding basics reviewed;Expressed milk;DEBP;Education;Pace feeding;LC Services brochure;CDC milk storage guidelines;Guidelines for Milk Supply and Pumping Schedule Handout;CDC Guidelines for Breast Pump  Cleaning  Discharge Pump: DEBP;Personal  Consult Status Consult Status: Follow-up Date: 01/27/24 Follow-up type: In-patient    Rachel Marshall 01/26/2024, 7:00 PM

## 2024-01-27 ENCOUNTER — Encounter (HOSPITAL_COMMUNITY)
Admission: AD | Disposition: A | Discharge status: Home / Self Care | Payer: Self-pay | Source: Home / Self Care | Attending: Family Medicine

## 2024-01-27 ENCOUNTER — Other Ambulatory Visit (HOSPITAL_COMMUNITY): Payer: Self-pay

## 2024-01-27 LAB — CBC
HCT: 32.4 % — ABNORMAL LOW (ref 36.0–46.0)
Hemoglobin: 11.1 g/dL — ABNORMAL LOW (ref 12.0–15.0)
MCH: 30.2 pg (ref 26.0–34.0)
MCHC: 34.3 g/dL (ref 30.0–36.0)
MCV: 88.3 fL (ref 80.0–100.0)
Platelets: 157 10*3/uL (ref 150–400)
RBC: 3.67 MIL/uL — ABNORMAL LOW (ref 3.87–5.11)
RDW: 13.6 % (ref 11.5–15.5)
WBC: 10.1 10*3/uL (ref 4.0–10.5)
nRBC: 0 % (ref 0.0–0.2)

## 2024-01-27 SURGERY — LIGATION, FALLOPIAN TUBE, POSTPARTUM
Anesthesia: Choice | Laterality: Bilateral

## 2024-01-27 MED ORDER — NIFEDIPINE ER OSMOTIC RELEASE 30 MG PO TB24: 30.0000 mg | ORAL_TABLET | Freq: Every day | ORAL | Status: DC

## 2024-01-27 MED ORDER — FUROSEMIDE 20 MG PO TABS
20.0000 mg | ORAL_TABLET | Freq: Every day | ORAL | 0 refills | Status: DC
Start: 2024-01-28 — End: 2024-06-06
  Filled 2024-01-27: qty 4, 4d supply, fill #0

## 2024-01-27 MED ORDER — POTASSIUM CHLORIDE CRYS ER 20 MEQ PO TBCR
20.0000 meq | EXTENDED_RELEASE_TABLET | Freq: Every day | ORAL | 0 refills | Status: DC
  Filled 2024-01-27: qty 4, 4d supply, fill #0

## 2024-01-27 NOTE — Patient Instructions (Signed)
 If interested in an outpatient lactation consult in office or virtually please reach out to [place] at [facility] (First Floor) [address], [place] Please call [phone] and press [phone] for lactation.

## 2024-01-27 NOTE — Anesthesia Postprocedure Evaluation (Signed)
 Anesthesia Post Note  Patient: Shakerra Royal  Procedure(s) Performed: LIGATION, FALLOPIAN TUBE, POSTPARTUM (Bilateral)     Patient location during evaluation: PACU Anesthesia Type: General Level of consciousness: awake and alert Pain management: pain level controlled Vital Signs Assessment: post-procedure vital signs reviewed and stable Respiratory status: spontaneous breathing, nonlabored ventilation and respiratory function stable Cardiovascular status: blood pressure returned to baseline and stable Postop Assessment: no apparent nausea or vomiting Anesthetic complications: no   No notable events documented.  Last Vitals:  Vitals:   01/27/24 1009 01/27/24 1420  BP: 125/80 128/79  Pulse: 79 78  Resp: 18 18  Temp: 36.6 C 36.7 C  SpO2: 99% 99%    Last Pain:  Vitals:   01/27/24 1458  TempSrc:   PainSc: 0-No pain   Pain Goal: Patients Stated Pain Goal: 0 (01/26/24 0156)                 Earvin Goldberg

## 2024-01-27 NOTE — Anesthesia Postprocedure Evaluation (Signed)
 Anesthesia Post Note  Patient: Rachel Marshall  Procedure(s) Performed: LIGATION, FALLOPIAN TUBE, POSTPARTUM (Bilateral)     Patient location during evaluation: Mother Baby Anesthesia Type: Epidural Level of consciousness: awake and alert Pain management: pain level controlled Vital Signs Assessment: post-procedure vital signs reviewed and stable Respiratory status: spontaneous breathing, nonlabored ventilation and respiratory function stable Cardiovascular status: stable Postop Assessment: no headache, no backache and epidural receding Anesthetic complications: no   No notable events documented.  Last Vitals:  Vitals:   01/27/24 0500 01/27/24 1009  BP: (!) 142/85 125/80  Pulse:  79  Resp:  18  Temp:  36.6 C  SpO2:  99%    Last Pain:  Vitals:   01/27/24 1009  TempSrc: Oral  PainSc:    Pain Goal: Patients Stated Pain Goal: 0 (01/26/24 0156)                 Rosaland Collie

## 2024-01-27 NOTE — Lactation Note (Signed)
 This note was copied from a baby's chart. Lactation Consultation Note  Patient Name: Rachel Marshall [id number] Date: 01/27/2024 Age:27 hours Reason for consult: Follow-up assessment;Early term 37-38.6wks  P2- [name] was preparing for discharge when [facility] checked on how things are going. [name] reports that infant has been nursing very well and her breasts are doing well also. [name] denies having any questions or concerns at this time. LC reviewed the engorgement/breast care protocol. LC encouraged [name] to call for further assistance as needed.  Maternal Data Has patient been taught Hand Expression?: Yes Does the patient have breastfeeding experience prior to this delivery?: Yes  Feeding Mother's Current Feeding Choice: Breast Milk Nipple Type: Slow - flow  Lactation Tools Discussed/Used Pump Education: Milk Storage  Interventions Interventions: Breast feeding basics reviewed;Education;LC Services brochure  Discharge Discharge Education: Engorgement and breast care;Warning signs for feeding baby Pump: DEBP;Personal  Consult Status Consult Status: Complete Date: 01/27/24    Vernette Goo BS, IBCLC 01/27/2024, 4:01 PM

## 2024-01-27 NOTE — Progress Notes (Signed)
 Post Partum Day 1 Subjective: no complaints, up ad lib, voiding, tolerating PO, and + flatus  Objective: Blood pressure (!) 142/85, pulse 87, temperature 98.1 F (36.7 C), temperature source Oral, resp. rate 18, height 5\' 9"  (1.753 m), weight 107.5 kg, last menstrual period 05/10/2023, SpO2 97%, unknown if currently breastfeeding.  Physical Exam:  General: alert, cooperative, and no distress Lochia: appropriate Uterine Fundus: firm Incision: n/a DVT Evaluation: No evidence of DVT seen on physical exam.  Recent Labs    01/26/24 0227 01/27/24 0557  HGB 11.5* 11.1*  HCT 33.9* 32.4*    Assessment/Plan: BP elevated several times today.  Will start  procardia  XL 30 mg.  Cont Lasix and K+ and enrolled in pp baby Rx.  Patient desires permanent sterilization.  Other reversible forms of contraception were discussed with patient; she declines all other modalities. Risks of procedure discussed with patient including but not limited to: risk of regret, permanence of method, bleeding, infection, injury to surrounding organs and need for additional procedures.  Failure risk of 0.5-1% with increased risk of ectopic gestation if pregnancy occurs was also discussed with patient.    Patient verbalized understanding of these risks and wants to proceed with sterilization.  Written informed consent obtained.  To OR when ready.  Will plan for d/c late this after noon / early evening.    LOS: 1 day   Glorietta Lark, MD 01/27/2024, 10:01 AM

## 2024-01-31 ENCOUNTER — Ambulatory Visit

## 2024-01-31 ENCOUNTER — Encounter: Admitting: Obstetrics & Gynecology

## 2024-01-31 DIAGNOSIS — Z0130 Encounter for examination of blood pressure without abnormal findings: Secondary | ICD-10-CM

## 2024-01-31 NOTE — Progress Notes (Signed)
 Patient here for BP check.  No complaints offered.  Patient to return for PP appt.  Rachel Marshall [employer]

## 2024-02-07 ENCOUNTER — Encounter: Admitting: Obstetrics & Gynecology

## 2024-02-23 ENCOUNTER — Encounter: Payer: Self-pay | Admitting: Obstetrics and Gynecology

## 2024-02-23 ENCOUNTER — Ambulatory Visit: Admitting: Obstetrics and Gynecology

## 2024-02-23 DIAGNOSIS — Z3009 Encounter for other general counseling and advice on contraception: Secondary | ICD-10-CM

## 2024-02-23 NOTE — Addendum Note (Signed)
 Addended by: Lacey Pian A on: 02/23/2024 12:46 PM   Modules accepted: Orders

## 2024-02-23 NOTE — Progress Notes (Signed)
    Post Partum Visit Note  Rachel Marshall is a 28 y.o. G1P2002 female who presents for a postpartum visit. She is 4 weeks postpartum following a normal spontaneous vaginal delivery.  I have fully reviewed the prenatal and intrapartum course. The delivery was at 38 gestational weeks.  Anesthesia: epidural. Postpartum course has been good. Baby is doing well. Baby is feeding by breast. Bleeding brown. Bowel function is normal. Bladder function is normal. Patient is not sexually active. Contraception method is condoms. Postpartum depression screening: negative.   The pregnancy intention screening data noted above was reviewed. Potential methods of contraception were discussed. The patient elected to proceed with condoms. Plans BTL and vasectomy.    Health Maintenance Due  Topic Date Due   HPV VACCINES (1 - 3-dose series) Never done   COVID-19 Vaccine (1 - 2024-25 season) Never done    The following portions of the patient's history were reviewed and updated as appropriate: allergies, current medications, past family history, past medical history, past social history, past surgical history, and problem list.  Review of Systems Pertinent items are noted in HPI.  Objective:  LMP 05/10/2023    General:  alert and cooperative   Breasts:  not indicated  Lungs: Normal effort and rate  Heart:  Normal rate  Abdomen: Non tender  GU exam:  not indicated       Assessment:   1. Postpartum examination following vaginal delivery (Primary) 4 week postpartum exam.   Plan:   Essential components of care per ACOG recommendations:  1.  Mood and well being: Patient with negative depression screening today. Reviewed local resources for support.  - Patient tobacco use? No.   - hx of drug use? No.    2. Infant care and feeding:  -Patient currently breastmilk feeding? Yes. Discussed returning to work and pumping.  -Social determinants of health (SDOH) reviewed in EPIC. No concerns.  3.  Sexuality, contraception and birth spacing - Patient does not want a pregnancy in the next year.  Desired family size is 2 children.  - Reviewed reproductive life planning. Reviewed contraceptive methods based on pt preferences and effectiveness.  Patient desired Female Sterilization, Female Condom, and Vasectomy today.    4. Sleep and fatigue -Encouraged family/partner/community support of 4 hrs of uninterrupted sleep to help with mood and fatigue  5. Physical Recovery  - Discussed patients delivery and complications. She describes her labor as good. - Patient had a Vaginal, no problems at delivery. Patient had a superficial laceration- no stitches required. Perineal healing reviewed. Patient expressed understanding - Patient has urinary incontinence? No. - Patient is safe to resume physical and sexual activity when ready  6.  Health Maintenance - HM due items addressed Yes - Last pap smear  Diagnosis  Date Value Ref Range Status  08/09/2023   Final   - Negative for intraepithelial lesion or malignancy (NILM)   Pap smear not done at today's visit.  -Breast Cancer screening indicated? No.   7. Chronic Disease/Pregnancy Condition follow up: None  - PCP follow up  Doria Garden, [facility] 02/23/24 Center for [employer], [facility]

## 2024-05-30 ENCOUNTER — Encounter (HOSPITAL_COMMUNITY): Payer: Self-pay | Admitting: Obstetrics and Gynecology

## 2024-05-30 NOTE — Progress Notes (Signed)
 Spoke w/ via phone for pre-op interview--- Rachel Marshall needs dos----  CBC, T&S and UPT per Careers adviser.       Marshall results------ COVID test -----patient states asymptomatic no test needed Arrive at -------1100 NPO after MN NO Solid Food.  Clear liquids from MN until---1000 Pre-Surgery Ensure or G2:  Med rec completed Medications to take morning of surgery -----NONE Diabetic medication -----  GLP1 agonist last dose: GLP1 instructions:  Patient instructed no nail polish to be worn day of surgery Patient instructed to bring photo id and insurance card day of surgery Patient aware to have Driver (ride ) / caregiver    for 24 hours after surgery - Husband Rachel Marshall Patient Special Instructions ----- Pre-Op special Instructions -----  Patient verbalized understanding of instructions that were given at this phone interview. Patient denies chest pain, sob, fever, cough at the interview.

## 2024-06-06 ENCOUNTER — Other Ambulatory Visit: Payer: Self-pay

## 2024-06-06 ENCOUNTER — Ambulatory Visit (HOSPITAL_COMMUNITY)

## 2024-06-06 ENCOUNTER — Ambulatory Visit (HOSPITAL_COMMUNITY)
Admission: RE | Admit: 2024-06-06 | Discharge: 2024-06-06 | Disposition: A | Discharge status: Home / Self Care | Attending: Obstetrics and Gynecology | Admitting: Obstetrics and Gynecology

## 2024-06-06 ENCOUNTER — Encounter (HOSPITAL_COMMUNITY)
Admission: RE | Disposition: A | Discharge status: Home / Self Care | Payer: Self-pay | Source: Home / Self Care | Attending: Obstetrics and Gynecology

## 2024-06-06 ENCOUNTER — Encounter (HOSPITAL_COMMUNITY): Payer: Self-pay | Admitting: Obstetrics and Gynecology

## 2024-06-06 DIAGNOSIS — I10 Essential (primary) hypertension: Secondary | ICD-10-CM | POA: Diagnosis not present

## 2024-06-06 DIAGNOSIS — Z302 Encounter for sterilization: Secondary | ICD-10-CM

## 2024-06-06 DIAGNOSIS — G40909 Epilepsy, unspecified, not intractable, without status epilepticus: Secondary | ICD-10-CM | POA: Diagnosis not present

## 2024-06-06 HISTORY — PX: LAPAROSCOPIC BILATERAL SALPINGECTOMY: SHX5889

## 2024-06-06 LAB — TYPE AND SCREEN
ABO/RH(D): B POS
Antibody Screen: NEGATIVE

## 2024-06-06 LAB — CBC
HCT: 42.2 % (ref 36.0–46.0)
Hemoglobin: 14.1 g/dL (ref 12.0–15.0)
MCH: 29.4 pg (ref 26.0–34.0)
MCHC: 33.4 g/dL (ref 30.0–36.0)
MCV: 88.1 fL (ref 80.0–100.0)
Platelets: 240 K/uL (ref 150–400)
RBC: 4.79 MIL/uL (ref 3.87–5.11)
RDW: 12 % (ref 11.5–15.5)
WBC: 5.1 K/uL (ref 4.0–10.5)
nRBC: 0 % (ref 0.0–0.2)

## 2024-06-06 LAB — POCT PREGNANCY, URINE: Preg Test, Ur: NEGATIVE

## 2024-06-06 SURGERY — SALPINGECTOMY, BILATERAL, LAPAROSCOPIC
Anesthesia: General | Site: Pelvis

## 2024-06-06 MED ORDER — ACETAMINOPHEN 500 MG PO TABS
ORAL_TABLET | ORAL | Status: AC
  Filled 2024-06-06: qty 2

## 2024-06-06 MED ORDER — KETOROLAC TROMETHAMINE 30 MG/ML IJ SOLN
INTRAMUSCULAR | Status: AC
  Filled 2024-06-06: qty 1

## 2024-06-06 MED ORDER — CHLORHEXIDINE GLUCONATE 0.12 % MT SOLN
OROMUCOSAL | Status: AC
  Filled 2024-06-06: qty 15

## 2024-06-06 MED ORDER — ONDANSETRON HCL 4 MG/2ML IJ SOLN
INTRAMUSCULAR | Status: DC | PRN
  Administered 2024-06-06: 4 mg via INTRAVENOUS

## 2024-06-06 MED ORDER — LACTATED RINGERS IV SOLN
INTRAVENOUS | Status: DC
Start: 2024-06-06 — End: 2024-06-06

## 2024-06-06 MED ORDER — PROPOFOL 10 MG/ML IV BOLUS
INTRAVENOUS | Status: AC
  Filled 2024-06-06: qty 20

## 2024-06-06 MED ORDER — OXYCODONE HCL 5 MG PO TABS: 5.0000 mg | ORAL_TABLET | Freq: Once | ORAL | Status: DC | PRN

## 2024-06-06 MED ORDER — PROPOFOL 10 MG/ML IV BOLUS
INTRAVENOUS | Status: DC | PRN
  Administered 2024-06-06: 200 mg via INTRAVENOUS

## 2024-06-06 MED ORDER — MIDAZOLAM HCL 2 MG/2ML IJ SOLN
INTRAMUSCULAR | Status: AC
Start: 2024-06-06 — End: 2024-06-06
  Filled 2024-06-06: qty 2

## 2024-06-06 MED ORDER — SCOPOLAMINE 1 MG/3DAYS TD PT72: 1.0000 | MEDICATED_PATCH | TRANSDERMAL | Status: DC

## 2024-06-06 MED ORDER — KETOROLAC TROMETHAMINE 30 MG/ML IJ SOLN: 30.0000 mg | Freq: Once | INTRAMUSCULAR | Status: DC | PRN

## 2024-06-06 MED ORDER — PHENYLEPHRINE 80 MCG/ML (10ML) SYRINGE FOR IV PUSH (FOR BLOOD PRESSURE SUPPORT)
PREFILLED_SYRINGE | INTRAVENOUS | Status: DC | PRN
Start: 2024-06-06 — End: 2024-06-06
  Administered 2024-06-06: 80 ug via INTRAVENOUS

## 2024-06-06 MED ORDER — LIDOCAINE 2% (20 MG/ML) 5 ML SYRINGE
INTRAMUSCULAR | Status: DC | PRN
  Administered 2024-06-06: 50 mg via INTRAVENOUS
  Administered 2024-06-06: 100 mg via INTRAVENOUS

## 2024-06-06 MED ORDER — FENTANYL CITRATE (PF) 250 MCG/5ML IJ SOLN
INTRAMUSCULAR | Status: AC
  Filled 2024-06-06: qty 5

## 2024-06-06 MED ORDER — IBUPROFEN 800 MG PO TABS: 800.0000 mg | ORAL_TABLET | Freq: Three times a day (TID) | ORAL | 0 refills | Status: AC | PRN

## 2024-06-06 MED ORDER — DEXAMETHASONE SODIUM PHOSPHATE 10 MG/ML IJ SOLN
INTRAMUSCULAR | Status: DC | PRN
  Administered 2024-06-06: 10 mg via INTRAVENOUS

## 2024-06-06 MED ORDER — ONDANSETRON HCL 4 MG/2ML IJ SOLN
INTRAMUSCULAR | Status: AC
  Filled 2024-06-06: qty 2

## 2024-06-06 MED ORDER — SUGAMMADEX SODIUM 200 MG/2ML IV SOLN
INTRAVENOUS | Status: DC | PRN
  Administered 2024-06-06: 200 mg via INTRAVENOUS

## 2024-06-06 MED ORDER — KETOROLAC TROMETHAMINE 30 MG/ML IJ SOLN
INTRAMUSCULAR | Status: DC | PRN
  Administered 2024-06-06: 30 mg via INTRAVENOUS

## 2024-06-06 MED ORDER — ACETAMINOPHEN 500 MG PO TABS
1000.0000 mg | ORAL_TABLET | ORAL | Status: AC
  Administered 2024-06-06: 1000 mg via ORAL

## 2024-06-06 MED ORDER — MIDAZOLAM HCL 2 MG/2ML IJ SOLN
INTRAMUSCULAR | Status: DC | PRN
Start: 2024-06-06 — End: 2024-06-06
  Administered 2024-06-06: 2 mg via INTRAVENOUS

## 2024-06-06 MED ORDER — OXYCODONE HCL 5 MG PO TABS: 5.0000 mg | ORAL_TABLET | ORAL | 0 refills | Status: AC | PRN

## 2024-06-06 MED ORDER — AMISULPRIDE (ANTIEMETIC) 5 MG/2ML IV SOLN: 10.0000 mg | Freq: Once | INTRAVENOUS | Status: DC | PRN

## 2024-06-06 MED ORDER — ORAL CARE MOUTH RINSE: 15.0000 mL | Freq: Once | OROMUCOSAL | Status: AC

## 2024-06-06 MED ORDER — CHLORHEXIDINE GLUCONATE 0.12 % MT SOLN
15.0000 mL | Freq: Once | OROMUCOSAL | Status: AC
  Administered 2024-06-06: 15 mL via OROMUCOSAL

## 2024-06-06 MED ORDER — 0.9 % SODIUM CHLORIDE (POUR BTL) OPTIME
TOPICAL | Status: DC | PRN
  Administered 2024-06-06: 1000 mL

## 2024-06-06 MED ORDER — LIDOCAINE 2% (20 MG/ML) 5 ML SYRINGE
INTRAMUSCULAR | Status: AC
  Filled 2024-06-06: qty 5

## 2024-06-06 MED ORDER — OXYCODONE HCL 5 MG/5ML PO SOLN: 5.0000 mg | Freq: Once | ORAL | Status: DC | PRN

## 2024-06-06 MED ORDER — POVIDONE-IODINE 10 % EX SWAB: 2.0000 | Freq: Once | CUTANEOUS | Status: DC

## 2024-06-06 MED ORDER — ROCURONIUM BROMIDE 10 MG/ML (PF) SYRINGE
PREFILLED_SYRINGE | INTRAVENOUS | Status: DC | PRN
Start: 2024-06-06 — End: 2024-06-06
  Administered 2024-06-06: 50 mg via INTRAVENOUS

## 2024-06-06 MED ORDER — DEXAMETHASONE SODIUM PHOSPHATE 10 MG/ML IJ SOLN
INTRAMUSCULAR | Status: AC
  Filled 2024-06-06: qty 1

## 2024-06-06 MED ORDER — FENTANYL CITRATE (PF) 100 MCG/2ML IJ SOLN: 25.0000 ug | INTRAMUSCULAR | Status: DC | PRN

## 2024-06-06 MED ORDER — ROCURONIUM BROMIDE 10 MG/ML (PF) SYRINGE
PREFILLED_SYRINGE | INTRAVENOUS | Status: AC
  Filled 2024-06-06: qty 10

## 2024-06-06 MED ORDER — BUPIVACAINE HCL (PF) 0.25 % IJ SOLN
INTRAMUSCULAR | Status: DC | PRN
  Administered 2024-06-06: 10 mL

## 2024-06-06 MED ORDER — SCOPOLAMINE 1 MG/3DAYS TD PT72
MEDICATED_PATCH | TRANSDERMAL | Status: AC
  Administered 2024-06-06: 1 mg via TRANSDERMAL
  Filled 2024-06-06: qty 1

## 2024-06-06 MED ORDER — FENTANYL CITRATE (PF) 250 MCG/5ML IJ SOLN
INTRAMUSCULAR | Status: DC | PRN
  Administered 2024-06-06 (×2): 100 ug via INTRAVENOUS

## 2024-06-06 SURGICAL SUPPLY — 32 items
BAG COUNTER SPONGE SURGICOUNT (BAG) IMPLANT
COVER MAYO STAND STRL (DRAPES) ×1 IMPLANT
DERMABOND ADVANCED .7 DNX12 (GAUZE/BANDAGES/DRESSINGS) ×1 IMPLANT
DRAPE SURG IRRIG POUCH 19X23 (DRAPES) ×1 IMPLANT
DURAPREP 26ML APPLICATOR (WOUND CARE) ×1 IMPLANT
GLOVE BIO SURGEON STRL SZ 6 (GLOVE) ×1 IMPLANT
GLOVE BIOGEL PI IND STRL 7.0 (GLOVE) ×2 IMPLANT
GLOVE BIOGEL PI IND STRL 7.5 (GLOVE) IMPLANT
GOWN STRL REUS W/ TWL LRG LVL3 (GOWN DISPOSABLE) ×2 IMPLANT
IRRIGATION SUCT STRKRFLW 2 WTP (MISCELLANEOUS) IMPLANT
KIT PINK PAD W/HEAD ARM REST (MISCELLANEOUS) ×1 IMPLANT
KIT TURNOVER KIT B (KITS) ×1 IMPLANT
LIGASURE VESSEL 5MM BLUNT TIP (ELECTROSURGICAL) IMPLANT
NDL INSUFFLATION 14GA 120MM (NEEDLE) ×1 IMPLANT
NEEDLE INSUFFLATION 14GA 120MM (NEEDLE) IMPLANT
PACK LAPAROSCOPY BASIN (CUSTOM PROCEDURE TRAY) ×1 IMPLANT
PAD POSITIONING PINK XL (MISCELLANEOUS) ×1 IMPLANT
POUCH LAPAROSCOPIC INSTRUMENT (MISCELLANEOUS) ×1 IMPLANT
SEALER TISSUE G2 CVD JAW 35 (ENDOMECHANICALS) IMPLANT
SET TUBE SMOKE EVAC HIGH FLOW (TUBING) ×1 IMPLANT
SHEARS HARMONIC 36 ACE (MISCELLANEOUS) IMPLANT
SLEEVE ADV FIXATION 5X100MM (TROCAR) ×1 IMPLANT
SUT VIC AB 4-0 PS2 18 (SUTURE) ×1 IMPLANT
SUT VICRYL 0 UR6 27IN ABS (SUTURE) ×1 IMPLANT
SYR 30ML LL (SYRINGE) IMPLANT
SYSTEM BAG RETRIEVAL 10MM (BASKET) IMPLANT
SYSTEM CARTER THOMASON II (TROCAR) IMPLANT
TOWEL GREEN STERILE FF (TOWEL DISPOSABLE) ×1 IMPLANT
TRAY FOLEY W/BAG SLVR 14FR (SET/KITS/TRAYS/PACK) ×1 IMPLANT
TROCAR 11X100 Z THREAD (TROCAR) ×1 IMPLANT
TROCAR ADV FIXATION 5X100MM (TROCAR) ×1 IMPLANT
WARMER LAPAROSCOPE (MISCELLANEOUS) ×1 IMPLANT

## 2024-06-06 NOTE — Anesthesia Preprocedure Evaluation (Addendum)
 Anesthesia Evaluation  Patient identified by MRN, date of birth, ID band Patient awake    Reviewed: Allergy & Precautions, NPO status , Patient's Chart, lab work & pertinent test results  Airway Mallampati: III  TM Distance: >3 FB Neck ROM: Full    Dental no notable dental hx.    Pulmonary neg pulmonary ROS   Pulmonary exam normal        Cardiovascular hypertension, negative cardio ROS Normal cardiovascular exam     Neuro/Psych Seizures -, Well Controlled,   negative psych ROS   GI/Hepatic negative GI ROS, Neg liver ROS,,,  Endo/Other  negative endocrine ROS    Renal/GU negative Renal ROS     Musculoskeletal negative musculoskeletal ROS (+)    Abdominal  (+) + obese  Peds  Hematology negative hematology ROS (+)   Anesthesia Other Findings Undesired fertility  Reproductive/Obstetrics Hcg negative                              Anesthesia Physical Anesthesia Plan  ASA: 2  Anesthesia Plan: General   Post-op Pain Management:    Induction: Intravenous  PONV Risk Score and Plan: Ondansetron , Dexamethasone , Midazolam , Scopolamine  patch - Pre-op and Treatment may vary due to age or medical condition  Airway Management Planned: Oral ETT  Additional Equipment:   Intra-op Plan:   Post-operative Plan: Extubation in OR  Informed Consent: I have reviewed the patients History and Physical, chart, labs and discussed the procedure including the risks, benefits and alternatives for the proposed anesthesia with the patient or authorized representative who has indicated his/her understanding and acceptance.     Dental advisory given  Plan Discussed with: CRNA  Anesthesia Plan Comments:          Anesthesia Quick Evaluation

## 2024-06-06 NOTE — H&P (Signed)
 [facility]ne[facility] Attending History and Physical  Cordelia Bessinger is a 28 y.o. [id number] who presented to [facility] today for bilateral laparoscopic salpingectomy.   She has no complaints.   Baby is doing well, now 20lbs at 4 month!   Past Medical History:  Diagnosis Date   Gestational hypertension 01/26/2024   History of syncope    Orthostatic hypotension 2020   Seizures (HCC)    one episode in 2013   Past Surgical History:  Procedure Laterality Date   WISDOM TOOTH EXTRACTION     OB History  Gravida Para Term Preterm AB Living  2 2 2   2   SAB IAB Ectopic Multiple Live Births     0 2    # Outcome Date GA Lbr Len/2nd Weight Sex Type Anes PTL Lv  2 Term 01/26/24 [redacted]w[redacted]d  3240 g F Vag-Spont EPI  LIV     Birth Comments: [id number] HUGS 588  1 Term 03/09/20 [redacted]w[redacted]d 10:36 / 00:34 3990 g M Vag-Spont EPI  LIV  Patient denies any other pertinent gynecologic issues.  No current facility-administered medications on file prior to encounter.   Current Outpatient Medications on File Prior to Encounter  Medication Sig Dispense Refill   Prenatal Vit-Fe Fumarate-FA (PRENATAL VITAMINS PO) Take by mouth.      furosemide  (LASIX ) 20 MG tablet Take 1 tablet (20 mg total) by mouth daily. (Patient not taking: Reported on 02/23/2024) 4 tablet 0   potassium chloride  SA (KLOR-CON  M) 20 MEQ tablet Take 1 tablet (20 mEq total) by mouth daily. (Patient not taking: Reported on 02/23/2024) 4 tablet 0   No Known Allergies  Social History:   reports that she has never smoked. She has never used smokeless tobacco. She reports that she does not currently use alcohol. She reports that she does not use drugs. Family History  Problem Relation Age of Onset   Breast cancer Maternal Grandmother    Hypertension Mother     Review of Systems: Pertinent items noted in HPI and remainder of comprehensive ROS otherwise negative.  PHYSICAL EXAM: Blood pressure 126/76, pulse 74, temperature 97.9 F (36.6 C),  temperature source Oral, resp. rate 17, height 5' 9 (1.753 m), weight 97.5 kg, SpO2 96%, currently breastfeeding. CONSTITUTIONAL: Well-developed, well-nourished female in no acute distress.  HENT:  Normocephalic, atraumatic, External right and left ear normal. Oropharynx is clear and moist EYES: Conjunctivae and EOM are normal. Pupils are equal, round, and reactive to light. No scleral icterus.  NECK: Normal range of motion, supple, no masses SKIN: Skin is warm and dry. No rash noted. Not diaphoretic. No erythema. No pallor. NEUROLOGIC: Alert and oriented to person, place, and time. Normal reflexes, muscle tone coordination. No cranial nerve deficit noted. PSYCHIATRIC: Normal mood and affect. Normal behavior. Normal judgment and thought content. CARDIOVASCULAR: Normal heart rate RESPIRATORY: Effort and breath sounds normal, no problems with respiration noted ABDOMEN: Soft, nontender, nondistended. PELVIC: Not examined MUSCULOSKELETAL: Normal range of motion. No tenderness.  No cyanosis, clubbing, or edema.  2+ distal pulses.  Labs: Results for orders placed or performed during the hospital encounter of 06/06/24 (from the past 2 weeks)  Pregnancy, urine POC   Collection Time: 06/06/24 11:23 AM  Result Value Ref Range   Preg Test, Ur NEGATIVE NEGATIVE    Imaging Studies: No results found.  Assessment: Active Problems:   Request for sterilization   Plan: - She desires permanent sterilization. Discussed alternatives including LARC options and vasectomy. She declines these options.  -  Discussed surgery of salpingectomy, meaning complete removal of tubes. No option for reversal.  - Risks of surgery include but are not limited to: bleeding, infection, injury to surrounding organs/tissues (i.e. bowel/bladder/ureters), need for additional procedures, wound complications, hospital re-admission, and conversion to open surgery, VTE - Reviewed restrictions and recovery following  surgery    Vina Solian, MD, FACOG Obstetrician & Gynecologist, [facility] for [facility], [facility]

## 2024-06-06 NOTE — Transfer of Care (Signed)
 Immediate Anesthesia Transfer of Care Note  Patient: Rachel Marshall  Procedure(s) Performed: SALPINGECTOMY, BILATERAL, LAPAROSCOPIC (Pelvis)  Patient Location: PACU  Anesthesia Type:General  Level of Consciousness: awake, alert , oriented, and patient cooperative  Airway & Oxygen Therapy: Patient Spontanous Breathing  Post-op Assessment: Report given to RN, Post -op Vital signs reviewed and stable, Patient moving all extremities X 4, and Patient able to stick tongue midline  Post vital signs: Reviewed  Last Vitals:  Vitals Value Taken Time  BP 131/73 06/06/24 13:37  Temp 97.7   Pulse 94 06/06/24 13:39  Resp 14 06/06/24 13:39  SpO2 94 % 06/06/24 13:39  Vitals shown include unfiled device data.  Last Pain:  Vitals:   06/06/24 1136  TempSrc: Oral  PainSc: 0-No pain      Patients Stated Pain Goal: 4 (06/06/24 1136)  Complications: No notable events documented.

## 2024-06-06 NOTE — Discharge Instructions (Signed)
     No acetaminophen /Tylenol  until after 5:45 pm today if needed.     Post Anesthesia Home Care Instructions  Activity: Get plenty of rest for the remainder of the day. A responsible individual must stay with you for 24 hours following the procedure.  For the next 24 hours, DO NOT: -Drive a car -Advertising copywriter -Drink alcoholic beverages -Take any medication unless instructed by your physician -Make any legal decisions or sign important papers.  Meals: Start with liquid foods such as gelatin or soup. Progress to regular foods as tolerated. Avoid greasy, spicy, heavy foods. If nausea and/or vomiting occur, drink only clear liquids until the nausea and/or vomiting subsides. Call your physician if vomiting continues.  Special Instructions/Symptoms: Your throat may feel dry or sore from the anesthesia or the breathing tube placed in your throat during surgery. If this causes discomfort, gargle with warm salt water. The discomfort should disappear within 24 hours.  If you had a scopolamine  patch placed behind your ear for the management of post- operative nausea and/or vomiting:  1. The medication in the patch is effective for 72 hours, after which it should be removed.  Wrap patch in a tissue and discard in the trash. Wash hands thoroughly with soap and water. 2. You may remove the patch earlier than 72 hours if you experience unpleasant side effects which may include dry mouth, dizziness or visual disturbances. 3. Avoid touching the patch. Wash your hands with soap and water after contact with the patch.

## 2024-06-06 NOTE — Anesthesia Procedure Notes (Signed)
 Procedure Name: Intubation Date/Time: 06/06/2024 12:55 PM  Performed by: Viviana Almarie DASEN, CRNAPre-anesthesia Checklist: Patient identified, Emergency Drugs available, Suction available and Patient being monitored Patient Re-evaluated:Patient Re-evaluated prior to induction Oxygen Delivery Method: Circle System Utilized Preoxygenation: Pre-oxygenation with 100% oxygen Induction Type: IV induction Ventilation: Mask ventilation without difficulty Grade View: Grade I Tube type: Oral Tube size: 6.5 mm Number of attempts: 1 Airway Equipment and Method: Stylet and Bite block Placement Confirmation: ETT inserted through vocal cords under direct vision, positive ETCO2 and breath sounds checked- equal and bilateral Secured at: 22 cm Tube secured with: Tape Dental Injury: Teeth and Oropharynx as per pre-operative assessment

## 2024-06-06 NOTE — Op Note (Signed)
 Hanaan Florendo PROCEDURE DATE: 06/06/2024   PREOPERATIVE DIAGNOSIS:  Undesired fertility  POSTOPERATIVE DIAGNOSIS:  Undesired fertility  PROCEDURE:  Laparoscopic Bilateral Salpingectomy   SURGEON:  Dr. Vina Solian  ASSISTANT:  Jorene Moats, PA-C. An experienced assistant was required given the standard of surgical care given the complexity of the case.  This assistant was needed for exposure, dissection, suctioning, retraction, instrument exchange, and for overall help during the procedure.  ANESTHESIA:  General endotracheal  COMPLICATIONS:  None immediate.  ESTIMATED BLOOD LOSS:  2 ml.  FLUIDS: 900 ml LR.  URINE OUTPUT:  Voided completely just before surgery.   INDICATIONS: 28 y.o. [id number] with undesired fertility, desires permanent sterilization.  Other forms of contraception were discussed with patient and emphasized alternatives of vasectomy, IUDs and Nexplanon as they have equivalent contraceptive efficacy; she declines all other modalities.  Risks of procedure discussed with patient including permanence of method, risk of regret, bleeding, infection, injury to surrounding organs and need for additional procedures including laparotomy.  Failure risk less than 0.5% with increased risk of ectopic gestation if pregnancy occurs was also discussed with patient.  Written informed consent was obtained.    FINDINGS:  Normal uterus, fallopian tubes, and ovaries.  TECHNIQUE:  The patient was taken to the operating room where general anesthesia was obtained without difficulty.  She was then placed in the dorsal supine position and prepared and draped in sterile fashion.  An adequate time out was performed.   A skin incision was made with the 11 blade scalpel in the umbilicus. I entered her abdominal cavity through the natural umbilical defect. S retractor was placed and used to guide the trocar into the abdomen and place under direct visualization.  Pneumoperitoneum achieved to a pressure of  15.  Below the point of entry and the pelvis was inspected with no evidence of injury.   The scalpel was then used to make one 5mm incsion in the LLQ and one 5 mm incision suprapubically. Pedi-ports were inserted. The ureters were identified bilaterally. The fallopian tubes were cauterized, cut, and detached from their surrounding pelvic structures with the Ligasure. No bleeding was noted. The specimens were removed through the ports. The pelvis was inspected and was hemostatic. All ports were then withdrawn and the gas drained from abdomen. They were then closed in a subcuticular fashion with 4-0 vicryl and dermabond was placed.  The patient will be discharged to home as per PACU criteria.  Routine postoperative instructions given.    Vina Solian, MD Attending Obstetrician & Gynecologist, [facility] for [facility], [facility]

## 2024-06-07 ENCOUNTER — Encounter (HOSPITAL_COMMUNITY): Payer: Self-pay | Admitting: Obstetrics and Gynecology

## 2024-06-07 ENCOUNTER — Ambulatory Visit: Payer: Self-pay | Admitting: Obstetrics and Gynecology

## 2024-06-07 LAB — SURGICAL PATHOLOGY

## 2024-06-07 NOTE — Anesthesia Postprocedure Evaluation (Signed)
 Anesthesia Post Note  Patient: Rachel Marshall  Procedure(s) Performed: SALPINGECTOMY, BILATERAL, LAPAROSCOPIC (Pelvis)     Patient location during evaluation: PACU Anesthesia Type: General Level of consciousness: awake Pain management: pain level controlled Vital Signs Assessment: post-procedure vital signs reviewed and stable Respiratory status: spontaneous breathing, nonlabored ventilation and respiratory function stable Cardiovascular status: blood pressure returned to baseline and stable Postop Assessment: no apparent nausea or vomiting Anesthetic complications: no   No notable events documented.  Last Vitals:  Vitals:   06/06/24 1400 06/06/24 1415  BP: 115/73 121/72  Pulse: (!) 51 74  Resp: 12 17  Temp:  36.4 C  SpO2: 96% 97%    Last Pain:  Vitals:   06/06/24 1415  TempSrc:   PainSc: 0-No pain                 Byanca Kasper P Dalya Maselli

## 2024-06-13 ENCOUNTER — Telehealth: Payer: Self-pay | Admitting: *Deleted

## 2024-06-13 NOTE — Telephone Encounter (Signed)
 Left patient a message to call and schedule 4 week Post Op with Dr. Cleatus.

## 2024-06-26 ENCOUNTER — Telehealth: Payer: Self-pay | Admitting: *Deleted

## 2024-06-26 NOTE — Telephone Encounter (Signed)
 Returned call from 06/23/2024 at 2:57 PM, office closed at 12:00 PM. Left patient a message to call and schedule 4 week Post Op appointment with Dr. Cleatus.
